# Patient Record
Sex: Female | Born: 1980 | Race: Black or African American | Hispanic: No | Marital: Married | Smoking: Never smoker
Health system: Southern US, Community
[De-identification: ages and names within clinical notes are randomized; demographics above are authoritative.]

## PROBLEM LIST (undated history)

## (undated) DIAGNOSIS — O24419 Gestational diabetes mellitus in pregnancy, unspecified control: Secondary | ICD-10-CM

## (undated) DIAGNOSIS — K589 Irritable bowel syndrome without diarrhea: Secondary | ICD-10-CM

## (undated) DIAGNOSIS — O09219 Supervision of pregnancy with history of pre-term labor, unspecified trimester: Secondary | ICD-10-CM

## (undated) DIAGNOSIS — I471 Supraventricular tachycardia: Secondary | ICD-10-CM

## (undated) DIAGNOSIS — O24414 Gestational diabetes mellitus in pregnancy, insulin controlled: Secondary | ICD-10-CM

## (undated) DIAGNOSIS — I499 Cardiac arrhythmia, unspecified: Secondary | ICD-10-CM

## (undated) DIAGNOSIS — E119 Type 2 diabetes mellitus without complications: Secondary | ICD-10-CM

## (undated) DIAGNOSIS — D649 Anemia, unspecified: Secondary | ICD-10-CM

## (undated) HISTORY — PX: FOOT SURGERY: SHX648

## (undated) HISTORY — DX: Type 2 diabetes mellitus without complications: E11.9

---

## 2002-07-29 HISTORY — PX: MOUTH SURGERY: SHX715

## 2010-04-12 ENCOUNTER — Emergency Department (HOSPITAL_COMMUNITY): Admission: EM | Admit: 2010-04-12 | Discharge: 2010-04-12 | Payer: Self-pay | Admitting: Emergency Medicine

## 2010-10-11 LAB — GC/CHLAMYDIA PROBE AMP, GENITAL: Chlamydia, DNA Probe: NEGATIVE

## 2010-10-11 LAB — WET PREP, GENITAL: Clue Cells Wet Prep HPF POC: NONE SEEN

## 2010-10-11 LAB — URINE MICROSCOPIC-ADD ON

## 2010-10-11 LAB — URINALYSIS, ROUTINE W REFLEX MICROSCOPIC
Bilirubin Urine: NEGATIVE
Glucose, UA: NEGATIVE mg/dL
Specific Gravity, Urine: 1.022 (ref 1.005–1.030)
Urobilinogen, UA: 0.2 mg/dL (ref 0.0–1.0)
pH: 6 (ref 5.0–8.0)

## 2012-07-29 NOTE — L&D Delivery Note (Signed)
Delivery Note At 7:27 AM a viable female, "[place]", was delivered via Vaginal, Spontaneous Delivery (Presentation: ; Occiput Anterior).  APGAR: 9, 9; weight .   Placenta status: Intact, Spontaneous Pathology.  Cord: 3 vessels with the following complications: None.  Cord pH: NA NICU team present for delivery due to prematurity.  Patient's labor had progressed rapidly after transfer to [facility] received her 2nd dose of Betamethasone at 6:13am and a 4 gm bolus of magnesium beginning at 6:07am for the neuroprophylaxis process.  She had a single gush of moderate bleeding during the immediate postdelivery phase--C[employer] 800 mcg placed per rectum for bleeding prophylaxis.  Anesthesia: None  Episiotomy: None Lacerations: None Suture Repair: NA Est. Blood Loss (mL): 400  Mom to postpartum.  Baby to NICU.  FOB to NICU with baby. Placenta to pathology.  Philomene Haff 01/29/2013, 8:02 AM

## 2012-09-15 LAB — OB RESULTS CONSOLE HEPATITIS B SURFACE ANTIGEN: Hepatitis B Surface Ag: NEGATIVE

## 2012-09-15 LAB — OB RESULTS CONSOLE RPR: RPR: NONREACTIVE

## 2012-09-15 LAB — OB RESULTS CONSOLE HIV ANTIBODY (ROUTINE TESTING)
HIV: NONREACTIVE
HIV: NONREACTIVE

## 2012-09-15 LAB — OB RESULTS CONSOLE ABO/RH: RH Type: POSITIVE

## 2013-01-13 ENCOUNTER — Encounter: Payer: Managed Care, Other (non HMO) | Attending: Obstetrics and Gynecology | Admitting: *Deleted

## 2013-01-13 VITALS — Ht 64.5 in | Wt 223.4 lb

## 2013-01-13 DIAGNOSIS — Z713 Dietary counseling and surveillance: Secondary | ICD-10-CM | POA: Insufficient documentation

## 2013-01-13 DIAGNOSIS — O99810 Abnormal glucose complicating pregnancy: Secondary | ICD-10-CM | POA: Insufficient documentation

## 2013-01-14 ENCOUNTER — Encounter: Payer: Self-pay | Admitting: *Deleted

## 2013-01-14 NOTE — Progress Notes (Signed)
  Patient was seen on 01/13/13 for Gestational Diabetes self-management class at [facility]. The following learning objectives were met by the patient during this course:   States the definition of Gestational Diabetes  States why dietary management is important in controlling blood glucose  Describes the effects each nutrient has on blood glucose levels  Demonstrates ability to create a balanced meal plan  Demonstrates carbohydrate counting   States when to check blood glucose levels  Demonstrates proper blood glucose monitoring techniques  States the effect of stress and exercise on blood glucose levels  States the importance of limiting caffeine and abstaining from alcohol and smoking  Blood glucose monitor given: [employer] Next EZ Lot # [id number] Exp: 10/2013 Blood glucose reading: 107 md/dl  Patient instructed to monitor glucose levels: FBS: 60 - <90 2 hour: <120  *Patient received handouts:  Nutrition Diabetes and Pregnancy  Carbohydrate Counting List  Patient will be seen for follow-up as needed.

## 2013-01-14 NOTE — Patient Instructions (Signed)
Goals:  Check glucose levels per MD as instructed  Follow Gestational Diabetes Diet as instructed  Call for follow-up as needed    

## 2013-01-24 ENCOUNTER — Telehealth: Payer: Self-pay

## 2013-01-24 NOTE — Telephone Encounter (Signed)
Pt called to report 29 weeks and had green, mucousy d/c w/ slight odor this afternoon.  Has been at church.  Denies VB today, LOF.  No abdominal pain, cramping, or tightening.  Normal FM.  At office 2d ago and reports blood in urine.  Cx not checked Friday, but had been checked 2 weeks prior, and long/closed.  No vaginal irritation.  Reports post-coital bleeding last week, but none since.  Rh positive.  Offered MAU eval and declined for now to CTO d/c at present, and would like appointment at office tomorrow.  Rev'd PTL s/s and pt to f/u prn overnight if concerns, worsening d/c, or ctxs.  Rec'd pelvic and overall rest this evening, & adequate hydration.

## 2013-01-28 ENCOUNTER — Encounter (HOSPITAL_COMMUNITY): Payer: Self-pay | Admitting: *Deleted

## 2013-01-28 ENCOUNTER — Inpatient Hospital Stay (HOSPITAL_COMMUNITY)
Admission: AD | Admit: 2013-01-28 | Discharge: 2013-01-31 | DRG: 775 | Disposition: A | Discharge status: Home / Self Care | Payer: 59 | Source: Ambulatory Visit | Attending: Obstetrics and Gynecology | Admitting: Obstetrics and Gynecology

## 2013-01-28 ENCOUNTER — Ambulatory Visit (HOSPITAL_COMMUNITY): Payer: 59

## 2013-01-28 ENCOUNTER — Inpatient Hospital Stay (HOSPITAL_COMMUNITY): Payer: 59

## 2013-01-28 ENCOUNTER — Other Ambulatory Visit: Payer: Self-pay | Admitting: Obstetrics and Gynecology

## 2013-01-28 DIAGNOSIS — O4290 Premature rupture of membranes, unspecified as to length of time between rupture and onset of labor, unspecified weeks of gestation: Principal | ICD-10-CM | POA: Diagnosis present

## 2013-01-28 DIAGNOSIS — O99814 Abnormal glucose complicating childbirth: Secondary | ICD-10-CM | POA: Diagnosis present

## 2013-01-28 HISTORY — DX: Anemia, unspecified: D64.9

## 2013-01-28 HISTORY — DX: Gestational diabetes mellitus in pregnancy, unspecified control: O24.419

## 2013-01-28 LAB — ABO/RH: ABO/RH(D): O POS

## 2013-01-28 LAB — TYPE AND SCREEN: Antibody Screen: NEGATIVE

## 2013-01-28 MED ORDER — AZITHROMYCIN 1 G PO PACK
1.0000 g | PACK | Freq: Once | ORAL | Status: AC
  Administered 2013-01-28: 1 g via ORAL
  Filled 2013-01-28: qty 1

## 2013-01-28 MED ORDER — DOCUSATE SODIUM 100 MG PO CAPS
100.0000 mg | ORAL_CAPSULE | Freq: Every day | ORAL | Status: DC
  Administered 2013-01-28: 100 mg via ORAL
  Filled 2013-01-28: qty 1

## 2013-01-28 MED ORDER — LACTATED RINGERS IV SOLN
INTRAVENOUS | Status: DC
  Administered 2013-01-28 – 2013-01-29 (×2): via INTRAVENOUS

## 2013-01-28 MED ORDER — MAGNESIUM SULFATE 40 G IN LACTATED RINGERS - SIMPLE
2.0000 g/h | INTRAVENOUS | Status: DC
  Administered 2013-01-28: 2 g/h via INTRAVENOUS
  Filled 2013-01-28: qty 500

## 2013-01-28 MED ORDER — BETAMETHASONE SOD PHOS & ACET 6 (3-3) MG/ML IJ SUSP
12.0000 mg | INTRAMUSCULAR | Status: DC
  Administered 2013-01-28: 12 mg via INTRAMUSCULAR
  Filled 2013-01-28 (×2): qty 2

## 2013-01-28 MED ORDER — CALCIUM CARBONATE ANTACID 500 MG PO CHEW: 2.0000 | CHEWABLE_TABLET | ORAL | Status: DC | PRN

## 2013-01-28 MED ORDER — ZOLPIDEM TARTRATE 5 MG PO TABS: 5.0000 mg | ORAL_TABLET | Freq: Every evening | ORAL | Status: DC | PRN

## 2013-01-28 MED ORDER — PRENATAL MULTIVITAMIN CH: 1.0000 | ORAL_TABLET | Freq: Every day | ORAL | Status: DC

## 2013-01-28 MED ORDER — MAGNESIUM SULFATE BOLUS VIA INFUSION
4.0000 g | Freq: Once | INTRAVENOUS | Status: AC
  Administered 2013-01-28: 4 g via INTRAVENOUS
  Filled 2013-01-28: qty 500

## 2013-01-28 MED ORDER — LACTATED RINGERS IV BOLUS (SEPSIS)
1000.0000 mL | Freq: Once | INTRAVENOUS | Status: AC
  Administered 2013-01-28: 1000 mL via INTRAVENOUS

## 2013-01-28 MED ORDER — ACETAMINOPHEN 325 MG PO TABS
650.0000 mg | ORAL_TABLET | ORAL | Status: DC | PRN
  Filled 2013-01-28: qty 2

## 2013-01-28 MED ORDER — SODIUM CHLORIDE 0.9 % IV SOLN
3.0000 g | Freq: Four times a day (QID) | INTRAVENOUS | Status: DC
  Administered 2013-01-28 – 2013-01-29 (×3): 3 g via INTRAVENOUS
  Filled 2013-01-28 (×5): qty 3

## 2013-01-28 MED ORDER — INSULIN ASPART 100 UNIT/ML ~~LOC~~ SOLN: 0.0000 [IU] | Freq: Three times a day (TID) | SUBCUTANEOUS | Status: DC

## 2013-01-28 NOTE — H&P (Signed)
Amber Davenport is a 32 y.o. female  pateint of Dr. [name]  ( G2 P0010 )at  30 wks and 3 days based on 8 wk ultrasound with EDD 04/05/2013. Pt presented to the office today complaining of lof this AM . Gross pooling on exam.  She denies abdominal tenderness. +FM no contractions.  Her pregnancy has been complicated by A1 DM . She reports that her bs have been well controlled.   She started her pregnancy at [facility] in [place] with Dr. Arlyce Harman. She tranferred to Dr. [name] at 27 wks for [facility].    Maternal Medical History:  Reason for admission: Rupture of membranes.   Contractions: Frequency: rare.    Fetal activity: Perceived fetal activity is normal.   Last perceived fetal movement was within the past hour.      OB History   Grav Para Term Preterm Abortions TAB SAB Ect Mult Living   1              G2 P0010    Past Medical History  Diagnosis Date  . Diabetes mellitus without complication   . Anemia   . Gestational diabetes    Past Surgical History  Procedure Laterality Date  . Mouth surgery  2004   Family History: family history includes Asthma in her brother; Drug abuse in her maternal grandfather; and Hypertension in her maternal grandmother. Social History:  reports that she has never smoked. She does not have any smokeless tobacco history on file. She reports that she does not drink alcohol or use illicit drugs.   Prenatal Transfer Tool  Maternal Diabetes: Yes:  Diabetes Type:  Diet controlled Genetic Screening: Normal Maternal Ultrasounds/Referrals: Normal Fetal Ultrasounds or other Referrals:  None Maternal Substance Abuse:  No Significant Maternal Medications:  None Significant Maternal Lab Results:  Lab values include: Other:  Other Comments:  PPROM at 30 wks and 3 days...   Review of Systems  Constitutional: Negative.   HENT: Negative.   Eyes: Negative.   Cardiovascular: Negative.   Gastrointestinal: Negative.    Genitourinary: Negative.   Musculoskeletal: Negative.   Neurological: Negative.   Psychiatric/Behavioral: Negative.       Blood pressure 132/74, pulse 97, temperature 98.3 F (36.8 C), temperature source Oral, resp. rate 20, height 5' 4.5" (1.638 m), weight 102.513 kg (226 lb), last menstrual period 06/29/2012, SpO2 96.00%. Maternal Exam:  Abdomen: Patient reports no abdominal tenderness. Fetal presentation: vertex  Introitus: Normal vulva. Normal vagina.    Fetal Exam Fetal Monitor Review: Mode: fetoscope.   Baseline rate: 130.  Variability: moderate (6-25 bpm).    Fetal State Assessment: Category I - tracings are normal.     Physical Exam  Constitutional: She is oriented to person, place, and time.  Cardiovascular: Normal rate and regular rhythm.   Respiratory: Effort normal.  Musculoskeletal: Normal range of motion.  Neurological: She is alert and oriented to person, place, and time.  Skin: Skin is warm and dry.  Sterile exam gross pooling.  cx1.5 /50 / -2 cephalic   Prenatal labs: ABO, Rh: --/--/O POS, O POS (07/03 1440) Antibody: NEG (07/03 1440) Rubella: Immune (02/18 0000) RPR: Nonreactive, Nonreactive, Nonreactive (02/18 0000)  HBsAg: Negative (02/18 0000)  HIV: Non-reactive, Non-reactive, Non-reactive (02/18 0000)  GBS:     Wet mount in office few clue cells other wise normal.... GBS in office pending. GC/ chl in office pending.  Assessment/Plan: 30 wks and 3 days with PPROM / Unasyn/ azithomycin  to prolong latency/ Bethamethasone for fetal lung maturity first dose given at 1411.  Ultrasound today BPP 4 out of 10... nst reactive./// no breathing or movement and low afi on ultrasound.  MFM consult was obtained.. Dr. Sherrie George suggested discontinuing the magnesium sulfate and  Repeating BPP in 12 hours. fiven the reassuring NST And maternal reports of fetal movement.   A1DM- CBG fasting and 2 hour postprandial ... Diabetic diet Appreciate NICU and MFM consult.       Waymon Laser J. 01/28/2013, 6:22 PM

## 2013-01-28 NOTE — Consult Note (Signed)
MFM Note  Amber Davenport is a 32 year old G2P0A1 at 30+3 weeks who was admitted today for spontaneous rupture of membranes. She denies any prenatal complications except for diet controlled gestational diabetes. This morning her membranes ruptured while at work. She reports good fetal movement and denies any uterine activity.  OB history: SAB at 6 weeks; no D&C  She has no other medical or surgical history except for occasional IBS. No drug allergies.  Since admission she has received BMZ, antibiotics and magnesium sulfate.  Ultrasound revealed a well grown fetus with EFW at the 38th %tile in a cephalic presentation. There was severe oligohydramnios and a BPP was 2/8 (-2 for no fluid from SROM, -2 for no BM and -2 for tone). The fetal heart tracing, however, was very reactive and good variability. It was decided to repeat the BPP in ~ 6 hours. Before the second BPP was preformed, however, magnesium sulfate was started for CP prophylaxis. The second BPP was 2/10 with 2 points for a reactive tracing. It was thought that the fetus was depressed due to the magnesium sulfate.  There were no signs or symptoms of infection. Her uterus was completely non tender.   Assessment: 1) IUP at 30 weeks; cephalic presentation 2) PPROM 3) Fetal status reassuring due to reactive tracing and patient's report of fetal movement 4) Diet controlled gestational diabetes 5) S/P NICU consultation 6) IBS  Plan:  1) Discontinue the magnesium sulfate 2) Repeat BPP in 12 hours; consider deliver if no improvement in BPP 3) Continuous monitoring 4) Complete course of BMZ 5) Deliver with s/s in infection; restart magnesium sulfate for CP px  Please call us with any questions or concerns.  (Face-to-face consultation with patient: 30 min)

## 2013-01-28 NOTE — Consult Note (Signed)
Neonatology Consult to Antenatal Patient:  I was asked by Dr. Richardson Dopp to see this patient in order to provide antenatal counseling due to premature ROM at [redacted] weeks GA.  Amber Davenport is admitted today at [redacted] weeks GA. Her pregnancy has been complicated by GDM. She had SROM today and is admitted for expectant management. She is currently not having active labor. She is getting BMZ, Magnesium Sulfate, and IV Ampicillin.  I spoke with the patient, her husband, and several family members. We discussed the worst case of delivery in the next 1-2 days, including usual DR management, possible respiratory complications and need for support, IV access, feedings (mother desires breast feeding, which was encourage), LOS, Mortality and Morbidity, and long term outcomes. They had several questions, which I answered. I offered a NICU tour to any interested family members and would be glad to come back if this couple or their family has more questions later.  Thank you for asking me to see this delightful patient.  Doretha Sou, MD Neonatologist  The total length of face-to-face or floor/unit time for this encounter was 30 minutes. Counseling and/or coordination of care was 20 minutes of the above.

## 2013-01-28 NOTE — Progress Notes (Signed)
Antenatal Nutrition Assessment:  Currently  30 3/[redacted] weeks gestation, with PROM, GDM diet controlled. Height  64.5 in Weight 226 Lbs pre-pregnancy weight 186 Lbs.Pre-pregnancy  BMI 31.5  IBW 123 Lbs  Total weight gain 40 Lbs. Weight gain goals 11-20 Lbs.   Estimated needs: 19-2100 kcal/day, 70-80 grams protein/day, 2.2 liters fluid/day  Gestational carbohydrate modified diet Current diet prescription will provide for increased needs of pregnancy  No abnormal nutrition related labs CBG (last 3)  No results found for this basename: GLUCAP,  in the last 72 hours   Nutrition Dx: Increased nutrient needs r/t pregnancy and fetal growth requirements aeb [redacted] weeks gestation.  No educational needs assessed at this time. Pt received GDM education at [facility] on 01/13/13  Elisabeth Cara M.Odis Luster LDN Neonatal Nutrition Support Specialist Pager [phone]

## 2013-01-29 ENCOUNTER — Other Ambulatory Visit (HOSPITAL_COMMUNITY): Payer: Managed Care, Other (non HMO)

## 2013-01-29 ENCOUNTER — Encounter (HOSPITAL_COMMUNITY): Payer: Self-pay | Admitting: *Deleted

## 2013-01-29 LAB — GLUCOSE, CAPILLARY: Glucose-Capillary: 103 mg/dL — ABNORMAL HIGH (ref 70–99)

## 2013-01-29 LAB — CBC
Hemoglobin: 11 g/dL — ABNORMAL LOW (ref 12.0–15.0)
Platelets: 164 10*3/uL (ref 150–400)
RBC: 3.98 MIL/uL (ref 3.87–5.11)
WBC: 14.2 10*3/uL — ABNORMAL HIGH (ref 4.0–10.5)

## 2013-01-29 MED ORDER — OXYCODONE-ACETAMINOPHEN 5-325 MG PO TABS: 1.0000 | ORAL_TABLET | ORAL | Status: DC | PRN

## 2013-01-29 MED ORDER — METHYLERGONOVINE MALEATE 0.2 MG/ML IJ SOLN: 0.2000 mg | INTRAMUSCULAR | Status: DC | PRN

## 2013-01-29 MED ORDER — LANOLIN HYDROUS EX OINT: TOPICAL_OINTMENT | CUTANEOUS | Status: DC | PRN

## 2013-01-29 MED ORDER — LIDOCAINE HCL (PF) 1 % IJ SOLN
INTRAMUSCULAR | Status: AC
  Filled 2013-01-29: qty 30

## 2013-01-29 MED ORDER — SENNOSIDES-DOCUSATE SODIUM 8.6-50 MG PO TABS
2.0000 | ORAL_TABLET | Freq: Every day | ORAL | Status: DC
  Administered 2013-01-29: 2 via ORAL

## 2013-01-29 MED ORDER — MAGNESIUM SULFATE BOLUS VIA INFUSION
4.0000 g | Freq: Once | INTRAVENOUS | Status: AC
  Administered 2013-01-29: 4 g via INTRAVENOUS
  Filled 2013-01-29: qty 500

## 2013-01-29 MED ORDER — DIBUCAINE 1 % RE OINT: 1.0000 "application " | TOPICAL_OINTMENT | RECTAL | Status: DC | PRN

## 2013-01-29 MED ORDER — MAGNESIUM SULFATE 40 G IN LACTATED RINGERS - SIMPLE
1.0000 g/h | INTRAVENOUS | Status: DC
  Filled 2013-01-29: qty 500

## 2013-01-29 MED ORDER — PRENATAL MULTIVITAMIN CH: 1.0000 | ORAL_TABLET | Freq: Every day | ORAL | Status: DC

## 2013-01-29 MED ORDER — FERROUS SULFATE 325 (65 FE) MG PO TABS
325.0000 mg | ORAL_TABLET | Freq: Two times a day (BID) | ORAL | Status: DC
  Administered 2013-01-29 – 2013-01-31 (×4): 325 mg via ORAL
  Filled 2013-01-29 (×4): qty 1

## 2013-01-29 MED ORDER — PRENATAL MULTIVITAMIN CH
1.0000 | ORAL_TABLET | Freq: Every day | ORAL | Status: DC
  Administered 2013-01-29 – 2013-01-30 (×2): 1 via ORAL
  Filled 2013-01-29 (×2): qty 1

## 2013-01-29 MED ORDER — ONDANSETRON HCL 4 MG/2ML IJ SOLN: 4.0000 mg | INTRAMUSCULAR | Status: DC | PRN

## 2013-01-29 MED ORDER — MAGNESIUM SULFATE 40 G IN LACTATED RINGERS - SIMPLE: 2.0000 g/h | INTRAVENOUS | Status: DC

## 2013-01-29 MED ORDER — BETAMETHASONE SOD PHOS & ACET 6 (3-3) MG/ML IJ SUSP
12.0000 mg | Freq: Once | INTRAMUSCULAR | Status: AC
  Administered 2013-01-29: 12 mg via INTRAMUSCULAR
  Filled 2013-01-29: qty 2

## 2013-01-29 MED ORDER — ZOLPIDEM TARTRATE 5 MG PO TABS: 5.0000 mg | ORAL_TABLET | Freq: Every evening | ORAL | Status: DC | PRN

## 2013-01-29 MED ORDER — DIPHENHYDRAMINE HCL 25 MG PO CAPS: 25.0000 mg | ORAL_CAPSULE | Freq: Four times a day (QID) | ORAL | Status: DC | PRN

## 2013-01-29 MED ORDER — OXYTOCIN 40 UNITS IN LACTATED RINGERS INFUSION - SIMPLE MED
INTRAVENOUS | Status: AC
  Administered 2013-01-29: 40 [IU]
  Filled 2013-01-29: qty 1000

## 2013-01-29 MED ORDER — IBUPROFEN 600 MG PO TABS
600.0000 mg | ORAL_TABLET | Freq: Four times a day (QID) | ORAL | Status: DC
  Administered 2013-01-29 – 2013-01-31 (×6): 600 mg via ORAL
  Filled 2013-01-29 (×8): qty 1

## 2013-01-29 MED ORDER — SIMETHICONE 80 MG PO CHEW: 80.0000 mg | CHEWABLE_TABLET | ORAL | Status: DC | PRN

## 2013-01-29 MED ORDER — TETANUS-DIPHTH-ACELL PERTUSSIS 5-2.5-18.5 LF-MCG/0.5 IM SUSP: 0.5000 mL | Freq: Once | INTRAMUSCULAR | Status: DC

## 2013-01-29 MED ORDER — METHYLERGONOVINE MALEATE 0.2 MG PO TABS: 0.2000 mg | ORAL_TABLET | ORAL | Status: DC | PRN

## 2013-01-29 MED ORDER — BENZOCAINE-MENTHOL 20-0.5 % EX AERO: 1.0000 "application " | INHALATION_SPRAY | CUTANEOUS | Status: DC | PRN

## 2013-01-29 MED ORDER — MISOPROSTOL 200 MCG PO TABS
ORAL_TABLET | ORAL | Status: AC
  Administered 2013-01-29: 800 ug
  Filled 2013-01-29: qty 4

## 2013-01-29 MED ORDER — WITCH HAZEL-GLYCERIN EX PADS: 1.0000 "application " | MEDICATED_PAD | CUTANEOUS | Status: DC | PRN

## 2013-01-29 MED ORDER — MAGNESIUM SULFATE 40 G IN LACTATED RINGERS - SIMPLE: 4.0000 g/h | Freq: Once | INTRAVENOUS | Status: DC

## 2013-01-29 MED ORDER — ONDANSETRON HCL 4 MG PO TABS: 4.0000 mg | ORAL_TABLET | ORAL | Status: DC | PRN

## 2013-01-29 NOTE — Consult Note (Signed)
Neonatology Note:  Attendance at Delivery:  I was asked by Dr. Cole to attend this NSVD at 30 4/[redacted] weeks GA following SROM yesterday and progression of labor. The mother is a G2P0A1 O pos, GBS not done yet with SROM about 24 hours prior to delivery, fluid clear. She is a diet-controlled GDM. She received antibiotics, magnesium sulfate, and Betamethasone times 1 dose (about 18 hours prior to delivery). Labor progressed. Mother remained afebrile during labor. Infant vigorous with good spontaneous cry and tone at birth. Needed bulb suctioning, then BBO2. O2 saturations were noted to be adequate only in BBO2, so we placed her on the [place] at 4 cm of pressure and 50% FIO2 for transport to the NICU. Her parents were able tohold her briefly in the DR with BBO2 on her. Ap 9/9. Her father accompanied her to The NICU.  Sherril Shipman C. Eren Puebla, MD  

## 2013-01-29 NOTE — Clinical Social Work Maternal (Signed)
Clinical Social Work Department PSYCHOSOCIAL ASSESSMENT - MATERNAL/CHILD 01/29/2013  Patient:  Amber Davenport,Amber Davenport  Account Number:  [id number]  Admit Date:  01/28/2013  Childs Name:   Amber Davenport    Clinical Social Worker:  Colbie Sliker, LCSW   Date/Time:  01/29/2013 01:55 PM  Date Referred:  01/29/2013   Referral source  NICU     Referred reason  NICU   Other referral source:    I:  FAMILY / HOME ENVIRONMENT Child's legal guardian:  PARENT  Guardian - Name Guardian - Age Guardian - Address  Amber Davenport [address] [place], [place] [address]  Amber Mcgough Jr.  same   Other household support members/support persons Other support:   MOB states she has a great support system.  There were numerous family members and friends with her today.    II  PSYCHOSOCIAL DATA Information Source:  Patient Interview  Financial and Community Resources Employment:   CSW did not discuss employment at this time.   Financial resources:  Private Insurance If Medicaid - County:    School / Grade:   Maternity Care Coordinator / Child Services Coordination / Early Interventions:  Cultural issues impacting care:   None identified    III  STRENGTHS Strengths  Adequate Resources  Compliance with medical plan  Home prepared for Child (including basic supplies)  Other - See comment  Supportive family/friends  Understanding of illness   Strength comment:  Pediatric follow up will be with Dr. Thomas at [facility].   IV  RISK FACTORS AND CURRENT PROBLEMS Current Problem:  None     V  SOCIAL WORK ASSESSMENT  CSW met with MOB in her third floor room/319 to introduce myself and complete assessment due to NICU admission.  MOB was very upbeat and pleasant, but just delivered this morning and had numerous visitors with her, so CSW offered to come back another if that would be better for her.  She told CSW that we could talk now.  She states she  and baby are doing great at this time.  She states she had no indication that baby would be born prematurely until yesterday when her water broke at work.  She appears to be coping very well with the situation at this point and reports a good understanding of baby's medical condition.  CSW discussed, in general terms, what baby will need to accomplish prior to being ready for discharge.  MOB states she hopes baby will not have to be here until her due date, which was 04/05/13.  CSW explained that although she may not need 10 weeks in NICU, CSW encouraged family to keep due date in mind so that they aren't continuously let down if baby is not ready to go home when the hope for.  CSW explained that there may be ups and downs throughout her hospitalization and advised to try to take things one day at a time instead of having expectations.  They seemed understanding and appreciative of this way of looking at the situation.  MOB reports having almost everything they need for baby and the ability to get the rest.  CSW recommended that they put some money aside for the car seat to see how big baby is closer to d/c and that bedside RN can help them determine the right seat for baby at that time.  CSW explained the ability for them to call a family conference at any time by contacting CSW and CSW told them not to   be alarmed if we contact them for a meeting.  CSW explained ongoing support services offered by NICU CSW and gave contact information.  CSW has no social concerns at this time.   VI SOCIAL WORK PLAN Social Work Plan  Psychosocial Support/Ongoing Assessment of Needs   Type of pt/family education:   What to expect from a NICU hospitalization (in general terms)  Ongoing support services offered by NICU CSW   If child protective services report - county:   If child protective services report - date:   Information/referral to community resources comment:   No referral needs identified at this time.   Other  social work plan:    

## 2013-01-29 NOTE — Progress Notes (Signed)
Patient ID: Amber Davenport, female   DOB: 10-19-1980, 32 y.o.   MRN: [id number] Amber Davenport is a 32 y.o. G1P0 at [redacted]w[redacted]d by LMP admitted for PROM.  Pt began having contractions this am  Subjective: Only c/o cramping        Objective: BP 134/70  Pulse 90  Temp(Src) 98.2 F (36.8 C) (Oral)  Resp 20  Ht 5' 4.5" (1.638 m)  Wt 226 lb (102.513 kg)  BMI 38.21 kg/m2  SpO2 96%  LMP 06/29/2012 I/O last 3 completed shifts: In: 1424.2 [P.O.:5; I.V.:1319.2; IV Piggyback:100] Out: 1250 [Urine:1250]    FHT:  FHR: 140s bpm, variability: moderate,  accelerations:  Present,  decelerations:  Present variables UC:   Not tracing but palpable SVE:   Dilation: 4 Effacement (%): 80 Station: 0;+1 Exam by:: Arvin Collard, RN  Labs: Lab Results  Component Value Date   WBC 14.2* 01/29/2013   HGB 11.0* 01/29/2013   HCT 33.2* 01/29/2013   MCV 83.4 01/29/2013   PLT 164 01/29/2013    Assessment  PPROM at 30 wks S/p single betamethasone dose Nonreassuring BPP  Preterm labor Fetal Wellbeing:  Category I  Plan Repeat betamethasone now Neuroprophylaxis dose of magnesium sulfate Move to L&D for probable delivery  Renato Spellman P 01/29/2013, 5:54 AM

## 2013-01-30 LAB — CBC
HCT: 30.7 % — ABNORMAL LOW (ref 36.0–46.0)
RDW: 14 % (ref 11.5–15.5)
WBC: 17.9 10*3/uL — ABNORMAL HIGH (ref 4.0–10.5)

## 2013-01-30 NOTE — Progress Notes (Signed)
Post Partum Day 1 s/p vaginal delivery at 30 wks and 4 days  Subjective: no complaints, up ad lib, voiding and tolerating PO  Objective: Blood pressure 140/79, pulse 76, temperature 97.6 F (36.4 C), temperature source Oral, resp. rate 17, height 5' 4.5" (1.638 m), weight 102.513 kg (226 lb), last menstrual period 06/29/2012, SpO2 100.00%, unknown if currently breastfeeding.  Physical Exam:  General: alert and cooperative Lochia: appropriate Uterine Fundus: firm Incision: NA DVT Evaluation: No evidence of DVT seen on physical exam.   Recent Labs  01/29/13 0520 01/30/13 0525  HGB 11.0* 10.2*  HCT 33.2* 30.7*    Assessment/Plan: Plan for discharge tomorrow, Breastfeeding and Lactation consult   LOS: 2 days   Amber Davenport J. 01/30/2013, 10:20 AM

## 2013-01-31 MED ORDER — IBUPROFEN 600 MG PO TABS: 600.0000 mg | ORAL_TABLET | Freq: Four times a day (QID) | ORAL | Status: DC | PRN

## 2013-01-31 NOTE — Discharge Summary (Signed)
Obstetric Discharge Summary Reason for Admission: rupture of membranes Prenatal Procedures: ultrasound Intrapartum Procedures: spontaneous vaginal delivery Postpartum Procedures: none Complications-Operative and Postpartum: none Hemoglobin  Date Value Range Status  01/30/2013 10.2* 12.0 - 15.0 g/dL Final     HCT  Date Value Range Status  01/30/2013 30.7* 36.0 - 46.0 % Final    Physical Exam:  General: alert and cooperative Lochia: appropriate Uterine Fundus: firm Incision: NA DVT Evaluation: No evidence of DVT seen on physical exam.  Discharge Diagnoses: Premature labor, PROM xpreterm delivery  hours and preterm delivery   Discharge Information: Date: 01/31/2013 Activity: pelvic rest Diet: routine Medications: PNV, Ibuprofen and Iron Condition: stable Instructions: refer to practice specific booklet Discharge to: home Follow-up Information   Follow up with [name], MD. Schedule an appointment as soon as possible for a visit in 6 weeks. (postpartum visit )    Contact information:   [address], [address] [place] [address] [phone]       Newborn Data: Live born female  Birth Weight:  APGAR: 9, 9  Home with mother.  Amber Roulston J. 01/31/2013, 8:28 AM

## 2013-01-31 NOTE — Progress Notes (Signed)
Discharge instructions provided to patient at bedside.  Medications, activity, follow up appointments, when to call the doctor and community resources discussed.  No questions at this time.  Patient left unit in stable condition with all personal belongings.  Osvaldo Angst, RN-----------

## 2013-02-01 ENCOUNTER — Encounter (HOSPITAL_COMMUNITY): Payer: Self-pay | Admitting: *Deleted

## 2013-02-01 NOTE — Progress Notes (Signed)
Ur chart review completed.  

## 2013-02-17 ENCOUNTER — Ambulatory Visit: Payer: Self-pay

## 2013-02-17 NOTE — Lactation Note (Signed)
This note was copied from the chart of Girl Bekah Igoe. Lactation Consultation Note  Patient Name: Girl Enijah Furr [id number] Date: 02/17/2013     Maternal Data    Feeding Feeding Type: Breast Milk Length of feed: 30 min  LATCH Score/Interventions                      Lactation Tools Discussed/Used     Consult Status    Follow up consult with this mom of a now 33 2/7 week corrected gestation baby, weighing 3-12.9. Mom nuzzled with baby yesterday, and was disappointed that the baby was sleepy at the breast. Mom told me she was not going to try until the baby was older. I told her it was normal and ok that the baby did not suckle, and that she need not stop nuzzling with ng feeds. I explained that is is still breast feeding for her baby, just to be skin to skin, smell and taste mom;s milk, for now. Mom seemed a bit relieved, but not quite convinced. i will follow this family in the NICU      Alfred Levins 02/17/2013, 6:17 PM

## 2013-03-17 ENCOUNTER — Other Ambulatory Visit: Payer: Self-pay | Admitting: Obstetrics and Gynecology

## 2013-03-17 ENCOUNTER — Other Ambulatory Visit (HOSPITAL_COMMUNITY)
Admission: RE | Admit: 2013-03-17 | Discharge: 2013-03-17 | Disposition: A | Discharge status: Home / Self Care | Payer: 59 | Source: Ambulatory Visit | Attending: Obstetrics and Gynecology | Admitting: Obstetrics and Gynecology

## 2013-03-17 DIAGNOSIS — Z01419 Encounter for gynecological examination (general) (routine) without abnormal findings: Secondary | ICD-10-CM | POA: Insufficient documentation

## 2013-03-17 DIAGNOSIS — Z1151 Encounter for screening for human papillomavirus (HPV): Secondary | ICD-10-CM | POA: Insufficient documentation

## 2013-07-04 ENCOUNTER — Emergency Department (HOSPITAL_COMMUNITY)
Admission: EM | Admit: 2013-07-04 | Discharge: 2013-07-04 | Disposition: A | Discharge status: Home / Self Care | Payer: 59 | Attending: Emergency Medicine | Admitting: Emergency Medicine

## 2013-07-04 ENCOUNTER — Encounter (HOSPITAL_COMMUNITY): Payer: Self-pay | Admitting: Emergency Medicine

## 2013-07-04 DIAGNOSIS — Z862 Personal history of diseases of the blood and blood-forming organs and certain disorders involving the immune mechanism: Secondary | ICD-10-CM | POA: Insufficient documentation

## 2013-07-04 DIAGNOSIS — Z3202 Encounter for pregnancy test, result negative: Secondary | ICD-10-CM | POA: Insufficient documentation

## 2013-07-04 DIAGNOSIS — N390 Urinary tract infection, site not specified: Secondary | ICD-10-CM | POA: Insufficient documentation

## 2013-07-04 DIAGNOSIS — E119 Type 2 diabetes mellitus without complications: Secondary | ICD-10-CM | POA: Insufficient documentation

## 2013-07-04 DIAGNOSIS — Z91040 Latex allergy status: Secondary | ICD-10-CM | POA: Insufficient documentation

## 2013-07-04 LAB — URINALYSIS, ROUTINE W REFLEX MICROSCOPIC
Glucose, UA: NEGATIVE mg/dL
Nitrite: NEGATIVE
Specific Gravity, Urine: 1.034 — ABNORMAL HIGH (ref 1.005–1.030)
pH: 5.5 (ref 5.0–8.0)

## 2013-07-04 LAB — URINE MICROSCOPIC-ADD ON

## 2013-07-04 LAB — PREGNANCY, URINE: Preg Test, Ur: NEGATIVE

## 2013-07-04 MED ORDER — CEPHALEXIN 500 MG PO CAPS: 500.0000 mg | ORAL_CAPSULE | Freq: Two times a day (BID) | ORAL | Status: DC

## 2013-07-04 NOTE — ED Provider Notes (Signed)
CSN: [id number]     Arrival date & time 07/04/13  1430 History   First MD Initiated Contact with Patient 07/04/13 1600     Chief Complaint  Patient presents with  . Urinary Tract Infection   (Consider location/radiation/quality/duration/timing/severity/associated sxs/prior Treatment) HPI Comments: Patient is a 32 year old female with no significant past medical history who presents today for dysuria with onset this morning. Patient states that every time she urinates it burns. She endorses associated urinary urgency and frequency with decreased stream. Patient also noticed some blood on the toilet tissue when wiping, but states she believes this to be from her vaginal canal. Last menstrual period 2 weeks ago. Patient denies associated fever, shortness of breath, nausea or vomiting, diarrhea, melena or hematochezia, pelvic pain, vaginal discharge, dyspareunia, abdominal pain or flank pain, numbness/tingling, or weakness. Patient states she has a primary care doctor that she can followup with.  Patient is a 32 y.o. female presenting with urinary tract infection. The history is provided by the patient. No language interpreter was used.  Urinary Tract Infection Pertinent negatives include no abdominal pain, fever, nausea or vomiting.    Past Medical History  Diagnosis Date  . Diabetes mellitus without complication   . Anemia   . Gestational diabetes    Past Surgical History  Procedure Laterality Date  . Mouth surgery  2004   Family History  Problem Relation Age of Onset  . Asthma Brother   . Hypertension Maternal Grandmother   . Drug abuse Maternal Grandfather    History  Substance Use Topics  . Smoking status: Never Smoker   . Smokeless tobacco: Not on file  . Alcohol Use: No   OB History   Grav Para Term Preterm Abortions TAB SAB Ect Mult Living   1 1  1      1      Review of Systems  Constitutional: Negative for fever.  Gastrointestinal: Negative for nausea, vomiting,  abdominal pain and diarrhea.  Genitourinary: Positive for dysuria, urgency, frequency and vaginal bleeding. Negative for flank pain and pelvic pain.  All other systems reviewed and are negative.    Allergies  Latex  Home Medications   Current Outpatient Rx  Name  Route  Sig  Dispense  Refill  . cephALEXin (KEFLEX) 500 MG capsule   Oral   Take 1 capsule (500 mg total) by mouth 2 (two) times daily.   14 capsule   0    BP 139/94  Pulse 93  Temp(Src) 97.7 F (36.5 C) (Oral)  Resp 20  SpO2 98%  Physical Exam  Nursing note and vitals reviewed. Constitutional: She is oriented to person, place, and time. She appears well-developed and well-nourished. No distress.  HENT:  Head: Normocephalic and atraumatic.  Eyes: Conjunctivae and EOM are normal. No scleral icterus.  Neck: Normal range of motion.  Cardiovascular: Normal rate, regular rhythm and normal heart sounds.   Pulmonary/Chest: Effort normal and breath sounds normal. No respiratory distress. She has no wheezes. She has no rales.  Abdominal: Soft. She exhibits no distension. There is no tenderness. There is no rebound and no guarding.  No suprapubic or CVA tenderness. No peritoneal signs or guarding.  Musculoskeletal: Normal range of motion.  Neurological: She is alert and oriented to person, place, and time.  Skin: Skin is warm and dry. No rash noted. She is not diaphoretic. No erythema. No pallor.  Psychiatric: She has a normal mood and affect. Her behavior is normal.    ED Course  Procedures (including critical care time) Labs Review Labs Reviewed  URINALYSIS, ROUTINE W REFLEX MICROSCOPIC - Abnormal; Notable for the following:    APPearance TURBID (*)    Specific Gravity, Urine 1.034 (*)    Hgb urine dipstick LARGE (*)    Protein, ur 100 (*)    Leukocytes, UA LARGE (*)    All other components within normal limits  URINE MICROSCOPIC-ADD ON - Abnormal; Notable for the following:    Squamous Epithelial / LPF MANY  (*)    Bacteria, UA FEW (*)    All other components within normal limits  URINE CULTURE  PREGNANCY, URINE   Imaging Review No results found.  EKG Interpretation   None       MDM   1. UTI (urinary tract infection)    Uncomplicated urinary tract infection. Patient presents today for dysuria, urinary urgency, and urinary frequency with decreased stream. Patient well and nontoxic appearing, hemodynamically stable, and afebrile. Physical exam elicits no abdominal or CVA tenderness.  Urine pregnancy negative today and urinalysis suggestive of UTI. Urine culture in process. Patient stable and appropriate for discharge with primary care followup. Will put patient on course of Keflex for symptoms. Return precautions advised and patient agreeable to plan with no unaddressed concerns.    Antony Madura, PA-C 07/04/13 1645

## 2013-07-04 NOTE — ED Notes (Signed)
Pt reports onset urinary urgency, hematuria, and dysuria onset this am

## 2013-07-05 NOTE — ED Provider Notes (Signed)
Medical screening examination/treatment/procedure(s) were performed by non-physician practitioner and as supervising physician I was immediately available for consultation/collaboration.  EKG Interpretation   None        Juliet Rude. Rubin Payor, MD 07/05/13 2253

## 2013-07-06 LAB — URINE CULTURE

## 2013-07-09 ENCOUNTER — Telehealth (HOSPITAL_COMMUNITY): Payer: Self-pay | Admitting: Emergency Medicine

## 2013-07-09 NOTE — ED Notes (Signed)
Post ED Visit - Positive Culture Follow-up ° °Culture report reviewed by antimicrobial stewardship pharmacist: °[] Wes Dulaney, Pharm.D., BCPS °[x] Jeremy Frens, Pharm.D., BCPS °[] Elizabeth Martin, Pharm.D., BCPS °[] Minh Pham, Pharm.D., BCPS, AAHIVP °[] Michelle Turner, Pharm.D., BCPS, AAHIVP ° °Positive urine culture °Treated with Keflex, organism sensitive to the same and no further patient follow-up is required at this time. ° °Amber Davenport °07/09/2013, 9:58 AM ° ° °

## 2013-07-29 DIAGNOSIS — I499 Cardiac arrhythmia, unspecified: Secondary | ICD-10-CM

## 2013-07-29 HISTORY — DX: Cardiac arrhythmia, unspecified: I49.9

## 2014-02-09 LAB — OB RESULTS CONSOLE GC/CHLAMYDIA
Chlamydia: NEGATIVE
Gonorrhea: NEGATIVE

## 2014-02-28 LAB — OB RESULTS CONSOLE ABO/RH: RH TYPE: POSITIVE

## 2014-02-28 LAB — OB RESULTS CONSOLE HIV ANTIBODY (ROUTINE TESTING): HIV: NONREACTIVE

## 2014-02-28 LAB — OB RESULTS CONSOLE RUBELLA ANTIBODY, IGM: RUBELLA: IMMUNE

## 2014-02-28 LAB — OB RESULTS CONSOLE RPR: RPR: NONREACTIVE

## 2014-02-28 LAB — OB RESULTS CONSOLE ANTIBODY SCREEN: Antibody Screen: NEGATIVE

## 2014-02-28 LAB — OB RESULTS CONSOLE HEPATITIS B SURFACE ANTIGEN: HEP B S AG: NEGATIVE

## 2014-05-28 ENCOUNTER — Telehealth: Payer: Self-pay | Admitting: Obstetrics and Gynecology

## 2014-05-28 NOTE — Telephone Encounter (Signed)
TC from patient of Dr. Georgeanna LeaVarnado--5 1/2 months, now with nasal congestion and scratchy throat.  No fever or cough.  Older child has had cold with same sx.  Comfort measures reviewed, may take regular Sudafed, throat lozenges, Tylenol/Advil, Vick's Vapo Rub.  To f/u with Dr. Dion BodyVarnado at visit this week, and to f/u with [facility] prn.  Nigel BridgemanVicki Jeyla Bulger, CNM 05/28/14 1p

## 2014-05-28 NOTE — Telephone Encounter (Signed)
Entered in error

## 2014-05-30 ENCOUNTER — Encounter (HOSPITAL_COMMUNITY): Payer: Self-pay | Admitting: Emergency Medicine

## 2014-06-28 LAB — OB RESULTS CONSOLE HIV ANTIBODY (ROUTINE TESTING): HIV: NONREACTIVE

## 2014-07-21 ENCOUNTER — Other Ambulatory Visit: Payer: Self-pay | Admitting: Obstetrics and Gynecology

## 2014-07-21 DIAGNOSIS — O09213 Supervision of pregnancy with history of pre-term labor, third trimester: Secondary | ICD-10-CM

## 2014-07-25 ENCOUNTER — Inpatient Hospital Stay (HOSPITAL_COMMUNITY)
Admission: AD | Admit: 2014-07-25 | Discharge: 2014-07-25 | Disposition: A | Discharge status: Home / Self Care | Payer: 59 | Source: Ambulatory Visit | Attending: Obstetrics & Gynecology | Admitting: Obstetrics & Gynecology

## 2014-07-25 ENCOUNTER — Encounter (HOSPITAL_COMMUNITY): Payer: Self-pay | Admitting: *Deleted

## 2014-07-25 DIAGNOSIS — O99513 Diseases of the respiratory system complicating pregnancy, third trimester: Secondary | ICD-10-CM | POA: Diagnosis not present

## 2014-07-25 DIAGNOSIS — Z3A31 31 weeks gestation of pregnancy: Secondary | ICD-10-CM | POA: Insufficient documentation

## 2014-07-25 DIAGNOSIS — I471 Supraventricular tachycardia: Secondary | ICD-10-CM | POA: Insufficient documentation

## 2014-07-25 DIAGNOSIS — R000 Tachycardia, unspecified: Secondary | ICD-10-CM | POA: Diagnosis present

## 2014-07-25 DIAGNOSIS — Z0189 Encounter for other specified special examinations: Secondary | ICD-10-CM

## 2014-07-25 LAB — CBC
HEMATOCRIT: 35.3 % — AB (ref 36.0–46.0)
HEMOGLOBIN: 11.6 g/dL — AB (ref 12.0–15.0)
MCH: 27.9 pg (ref 26.0–34.0)
MCHC: 32.9 g/dL (ref 30.0–36.0)
MCV: 84.9 fL (ref 78.0–100.0)
Platelets: 157 10*3/uL (ref 150–400)
RBC: 4.16 MIL/uL (ref 3.87–5.11)
RDW: 14 % (ref 11.5–15.5)
WBC: 11.4 10*3/uL — ABNORMAL HIGH (ref 4.0–10.5)

## 2014-07-25 LAB — COMPREHENSIVE METABOLIC PANEL
ALT: 18 U/L (ref 0–35)
AST: 26 U/L (ref 0–37)
Albumin: 3.4 g/dL — ABNORMAL LOW (ref 3.5–5.2)
Alkaline Phosphatase: 56 U/L (ref 39–117)
Anion gap: 10 (ref 5–15)
BILIRUBIN TOTAL: 0.2 mg/dL — AB (ref 0.3–1.2)
BUN: 8 mg/dL (ref 6–23)
CALCIUM: 9.4 mg/dL (ref 8.4–10.5)
CHLORIDE: 104 meq/L (ref 96–112)
CO2: 22 mmol/L (ref 19–32)
CREATININE: 0.51 mg/dL (ref 0.50–1.10)
GLUCOSE: 104 mg/dL — AB (ref 70–99)
Potassium: 3.8 mmol/L (ref 3.5–5.1)
Sodium: 136 mmol/L (ref 135–145)
Total Protein: 6.5 g/dL (ref 6.0–8.3)

## 2014-07-25 LAB — TSH: TSH: 2.553 u[IU]/mL (ref 0.350–4.500)

## 2014-07-25 LAB — GLUCOSE, CAPILLARY: Glucose-Capillary: 93 mg/dL (ref 70–99)

## 2014-07-25 MED ORDER — ADENOSINE 6 MG/2ML IV SOLN
6.0000 mg | Freq: Once | INTRAVENOUS | Status: AC
  Administered 2014-07-25: 6 mg via INTRAVENOUS
  Filled 2014-07-25: qty 2

## 2014-07-25 MED ORDER — METOPROLOL TARTRATE 1 MG/ML IV SOLN
5.0000 mg | Freq: Once | INTRAVENOUS | Status: DC
  Filled 2014-07-25: qty 5

## 2014-07-25 MED ORDER — ADENOSINE 12 MG/4ML IV SOLN
12.0000 mg | Freq: Once | INTRAVENOUS | Status: AC
  Administered 2014-07-25: 12 mg via INTRAVENOUS
  Filled 2014-07-25: qty 4

## 2014-07-25 NOTE — MAU Note (Signed)
Dr Royann Shiversroitoru in with patient discussing plan

## 2014-07-25 NOTE — Consult Note (Signed)
Reason for Consult: SVT  Requesting Physician: Dion BodyVarnado  Cardiologist: None  HPI: This is a 33 y.o. female with a past medical history significant for gestational diabetes and anemia, now [redacted] weeks pregnant with her second child. Had very abrupt onset of rapid palpitations, but no angina, minimal dyspnea, no presyncope.  ECG shows SVT 160 bpm with no discernible P wave, maybe pseudor' in V1, c/w AVNRT. Valsalva maneuver did not work. Carotid sinus compression by me unsuccessful. Adenosine 6 mg IV slightly lengthened cycle length without stopping the SVT. Returned to NSR after adenosine 12 mg IV.  Occasional isolated ectopy in the past, no previous sustained arrhythmia.  PMHx:  Past Medical History  Diagnosis Date  . Diabetes mellitus without complication   . Anemia   . Gestational diabetes    Past Surgical History  Procedure Laterality Date  . Mouth surgery  2004    FAMHx: Family History  Problem Relation Age of Onset  . Asthma Brother   . Hypertension Maternal Grandmother   . Drug abuse Maternal Grandfather     SOCHx:  reports that she has never smoked. She does not have any smokeless tobacco history on file. She reports that she does not drink alcohol or use illicit drugs.  ALLERGIES: Allergies  Allergen Reactions  . Latex Itching    ROS: She reports good fetal movement, denies abdominal cramping/contractions, LOF, vaginal bleeding, vaginal itching/burning, urinary symptoms, h/a, dizziness, n/v, or fever/chills.  The patient specifically denies any chest pain at rest or exertion, dyspnea at rest or with exertion, orthopnea, paroxysmal nocturnal dyspnea, syncope, palpitations, focal neurological deficits, intermittent claudication, lower extremity edema, unexplained weight gain, cough, hemoptysis or wheezing.   HOME MEDICATIONS:  Prior to Admission:  Prescriptions prior to admission  Medication Sig Dispense Refill Last Dose  . Prenatal Vit-Fe  Fumarate-FA (PRENATAL MULTIVITAMIN) TABS tablet Take 1 tablet by mouth daily at 12 noon.   Past Week at Unknown time  . cephALEXin (KEFLEX) 500 MG capsule Take 1 capsule (500 mg total) by mouth 2 (two) times daily. (Patient not taking: Reported on 07/25/2014) 14 capsule 0     VITALS: Blood pressure 133/81, pulse 158, temperature 98.1 F (36.7 C), temperature source Oral, resp. rate 20, SpO2 100 %, unknown if currently breastfeeding.  PHYSICAL EXAM:  General: Alert, oriented x3, no distress Head: no evidence of trauma, PERRL, EOMI, no exophtalmos or lid lag, no myxedema, no xanthelasma; normal ears, nose and oropharynx Neck: normal jugular venous pulsations and no hepatojugular reflux; brisk carotid pulses without delay and no carotid bruits Chest: clear to auscultation, no signs of consolidation by percussion or palpation, normal fremitus, symmetrical and full respiratory excursions Cardiovascular: normal position and quality of the apical impulse, regular rhythm, normal first heart sound and normal second heart sound, no rubs or gallops, no murmur Abdomen: 3rd trimester gravid abdomen, no tenderness or distention, no masses by palpation, no abnormal pulsatility or arterial bruits, normal bowel sounds. Extremities: no clubbing, cyanosis;  no edema; 2+ radial, ulnar and brachial pulses bilaterally; 2+ right femoral, posterior tibial and dorsalis pedis pulses; 2+ left femoral, posterior tibial and dorsalis pedis pulses; no subclavian or femoral bruits Neurological: grossly nonfocal   LABS  CBC  Recent Labs  07/25/14 1645  WBC 11.4*  HGB 11.6*  HCT 35.3*  MCV 84.9  PLT 157   Basic Metabolic Panel  Recent Labs  07/25/14 1645  NA 136  K 3.8  CL 104  CO2 22  GLUCOSE  104*  BUN 8  CREATININE 0.51  CALCIUM 9.4   Liver Function Tests  Recent Labs  07/25/14 1645  AST 26  ALT 18  ALKPHOS 56  BILITOT 0.2*  PROT 6.5  ALBUMIN 3.4*   IMAGING: No results  found.  ECG: SVT, probably AVNRT, otherwise normal  IMPRESSION: 1. 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 Since this is her only symptomatic event by age 33, likely to be a low incidence problem. Small risk to fetal development of using beta blockers/calcium channel blockers is probably not justified unless frequent events. Discussed Valsalva maneuver and diving reflex as options to interrupt arrhythmia before seeking medical attention for episodes > 30 min. Will arrange f/u after expected delivery date.  Time Spent Directly with Patient: 60 minutes  Thurmon FairMihai Kariya Lavergne, MD, [facility] [phone] office [phone] pager   07/25/2014, 6:26 PM

## 2014-07-25 NOTE — MAU Provider Note (Signed)
Chief Complaint:  Tachycardia   None     HPI: Amber Davenport is a 33 y.o. G2P0101 at 48w2dho presents to maternity admissions reporting fast heartbeat.  She reports she was at work around 3 pm and drank a glass of water, then started feeling her heart race. She reports some mild shortness ob breath but denies chest pain.  She reports good fetal movement, denies abdominal cramping/contractions, LOF, vaginal bleeding, vaginal itching/burning, urinary symptoms, h/a, dizziness, n/v, or fever/chills.     Past Medical History: Past Medical History  Diagnosis Date  . Diabetes mellitus without complication   . Anemia   . Gestational diabetes     Past obstetric history: OB History  Gravida Para Term Preterm AB SAB TAB Ectopic Multiple Living  2 1  1      1     # Outcome Date GA Lbr Len/2nd Weight Sex Delivery Anes PTL Lv  2 Current           1 Preterm 01/29/13 389w4d1:05 / 00:22 1.37 kg (3 lb 0.3 oz) F Vag-Spont None  Y      Past Surgical History: Past Surgical History  Procedure Laterality Date  . Mouth surgery  2004    Family History: Family History  Problem Relation Age of Onset  . Asthma Brother   . Hypertension Maternal Grandmother   . Drug abuse Maternal Grandfather     Social History: History  Substance Use Topics  . Smoking status: Never Smoker   . Smokeless tobacco: Not on file  . Alcohol Use: No    Allergies:  Allergies  Allergen Reactions  . Latex Itching    Meds:  No prescriptions prior to admission    ROS: Pertinent findings in history of present illness.  Physical Exam  Blood pressure 138/87, pulse 114, temperature 98.1 F (36.7 C), temperature source Oral, resp. rate 16, SpO2 100 %, unknown if currently breastfeeding. GENERAL: Well-developed, well-nourished female in no acute distress.  HEENT: normocephalic HEART: normal rate RESP: normal effort ABDOMEN: Soft, non-tender, gravid appropriate for gestational age EXTREMITIES: Nontender, no  edema NEURO: alert and oriented    FHT:  Baseline 145, moderate variability, accelerations present, no decelerations Contractions: none on toco or to palpation   Labs:  Results for orders placed or performed during the hospital encounter of 07/25/14 (from the past 72 hour(s))  Glucose, capillary     Status: None   Collection Time: 07/25/14  4:44 PM  Result Value Ref Range   Glucose-Capillary 93 70 - 99 mg/dL  TSH     Status: None   Collection Time: 07/25/14  4:45 PM  Result Value Ref Range   TSH 2.553 0.350 - 4.500 uIU/mL    Comment[facility]     Status: Abnormal   Collection Time: 07/25/14  4:45 PM  Result Value Ref Range   WBC 11.4 (H) 4.0 - 10.5 K/uL   RBC 4.16 3.87 - 5.11 MIL/uL   Hemoglobin 11.6 (L) 12.0 - 15.0 g/dL   HCT 35.3 (L) 36.0 - 46.0 %   MCV 84.9 78.0 - 100.0 fL   MCH 27.9 26.0 - 34.0 pg   MCHC 32.9 30.0 - 36.0 g/dL   RDW 14.0 11.5 - 15.5 %   Platelets 157 150 - 400 K/uL  Comprehensive metabolic panel     Status: Abnormal   Collection Time: 07/25/14  4:45 PM  Result Value Ref Range   Sodium 136 135 - 145 mmol/L  Comment: Please note change in reference range.   Potassium 3.8 3.5 - 5.1 mmol/L    Comment: Please note change in reference range.   Chloride 104 96 - 112 mEq/L   CO2 22 19 - 32 mmol/L   Glucose, Bld 104 (H) 70 - 99 mg/dL   BUN 8 6 - 23 mg/dL   Creatinine, Ser 0.51 0.50 - 1.10 mg/dL   Calcium 9.4 8.4 - 10.5 mg/dL   Total Protein 6.5 6.0 - 8.3 g/dL   Albumin 3.4 (L) 3.5 - 5.2 g/dL   AST 26 0 - 37 U/L   ALT 18 0 - 35 U/L   Alkaline Phosphatase 56 39 - 117 U/L   Total Bilirubin 0.2 (L) 0.3 - 1.2 mg/dL   GFR calc non Af Amer >90 >90 mL/min   GFR calc Af Amer >90 >90 mL/min    Comment: (NOTE) The eGFR has been calculated using the CKD EPI equation. This calculation has not been validated in all clinical situations. eGFR's persistently <90 mL/min signify possible Chronic Kidney Disease.    Anion gap 10 5 - 15     ED Course CBC, CMP, LR bolus, continuous pulse oximeter, EKG Vasovagal maneuver x2 without response  Sanda Klein, MD, [facility] from [place] to bedside for cardioversion.  See notes in Epic.  Successful AVNRT converted to NSR with iv adenosine.  Pt tolerated procedure well.   Assessment: 1. SVT (supraventricular tachycardia)   2. Encounter for cardioversion procedure   3. AVNRT (AV nodal re-entry tachycardia)   4.  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 See notes from Dr Sallyanne Kuster in [place]. D/C home with follow up plan with cardiology after delivery      Follow-up Information    Follow up with Thurnell Lose, MD.   Specialty:  Obstetrics and Gynecology   Why:  As scheduled   Contact information:   [name], [name] [address] [phone]       Follow up with [facility].   Why:  As needed for emergencies   Contact information:   [address] [id number] [place] [phone]       Medication List    STOP taking these medications        cephALEXin 500 MG capsule  Commonly known as:  KEFLEX      TAKE these medications        prenatal multivitamin Tabs tablet  Take 1 tablet by mouth daily at 12 noon.        Fatima Blank Certified Nurse-Midwife 07/27/2014 9:26 AM

## 2014-07-25 NOTE — MAU Note (Signed)
Was sitting at desk, took a drink of water, heart started racing.  Walked to co-workers desk- sensation continued.  Called dr's office.  Was told to put ice on pressure points. Used restroom, still racing. Sat back down. Talked to nurse and dr, was instructed to come here. No shortness of breath. Felt like she was getting punched in throat when first happened- no longer feeling that.

## 2014-07-25 NOTE — Discharge Instructions (Signed)
Supraventricular Tachycardia °Supraventricular tachycardia (SVT) is an abnormal heart rhythm (arrhythmia) that causes the heart to beat very fast (tachycardia). This kind of fast heartbeat originates in the upper chambers of the heart (atria). SVT can cause the heart to beat greater than 100 beats per minute. SVT can have a rapid burst of heartbeats. This can start and stop suddenly without warning and is called nonsustained. SVT can also be sustained, in which the heart beats at a continuous fast rate.  °CAUSES  °There can be different causes of SVT. Some of these include: °· Heart valve problems such as mitral valve prolapse. °· An enlarged heart (hypertrophic cardiomyopathy). °· Congenital heart problems. °· Heart inflammation (pericarditis). °· Hyperthyroidism. °· Low potassium or magnesium levels. °· Caffeine. °· Drug use such as cocaine, methamphetamines, or stimulants. °· Some over-the-counter medicines such as: °¨ Decongestants. °¨ Diet medicines. °¨ Herbal medicines. °SYMPTOMS  °Symptoms of SVT can vary. Symptoms depend on whether the SVT is sustained or nonsustained. You may experience: °· No symptoms (asymptomatic). °· An awareness of your heart beating rapidly (palpitations). °· Shortness of breath. °· Chest pain or pressure. °If your blood pressure drops because of the SVT, you may experience: °· Fainting or near fainting. °· Weakness. °· Dizziness. °DIAGNOSIS  °Different tests can be performed to diagnose SVT, such as: °· An electrocardiogram (EKG). This is a painless test that records the electrical activity of your heart. °· Holter monitor. This is a 24 hour recording of your heart rhythm. You will be given a diary. Write down all symptoms that you have and what you were doing at the time you experienced symptoms. °· Arrhythmia monitor. This is a small device that your wear for several weeks. It records the heart rhythm when you have symptoms. °· Echocardiogram. This is an imaging test to help detect  abnormal heart structure such as congenital abnormalities, heart valve problems, or heart enlargement. °· Stress test. This test can help determine if the SVT is related to exercise. °· Electrophysiology study (EPS). This is a procedure that evaluates your heart's electrical system and can help your caregiver find the cause of your SVT. °TREATMENT  °Treatment of SVT depends on the symptoms, how often it recurs, and whether there are any underlying heart problems.  °· If symptoms are rare and no other cardiac disease is present, no treatment may be needed. °· Blood work may be done to check potassium, magnesium, and thyroid hormone levels to see if they are abnormal. If these levels are abnormal, treatment to correct the problems will occur. °Medicines °Your caregiver may use oral medicines to treat SVT. These medicines are given for long-term control of SVT. Medicines may be used alone or in combination with other treatments. These medicines work to slow nerve impulses in the heart muscle. These medicines can also be used to treat high blood pressure. Some of these medicines may include: °· Calcium channel blockers. °· Beta blockers. °· Digoxin. °Nonsurgical procedures °Nonsurgical techniques may be used if oral medicines do not work. Some examples include: °· Cardioversion. This technique uses either drugs or an electrical shock to restore a normal heart rhythm. °¨ Cardioversion drugs may be given through an intravenous (IV) line to help "reset" the heart rhythm. °¨ In electrical cardioversion, the caregiver shocks your heart to stop its beat for a split second. This helps to reset the heart to a normal rhythm. °· Ablation. This procedure is done under mild sedation. High frequency radio wave energy is used to   destroy the area of heart tissue responsible for the SVT. °HOME CARE INSTRUCTIONS  °· Do not smoke. °· Only take medicines prescribed by your caregiver. Check with your caregiver before using over-the-counter  medicines. °· Check with your caregiver about how much alcohol and caffeine (coffee, tea, colas, or chocolate) you may have. °· It is very important to keep all follow-up referrals and appointments in order to properly manage this problem. °SEEK IMMEDIATE MEDICAL CARE IF: °· You have dizziness. °· You faint or nearly faint. °· You have shortness of breath. °· You have chest pain or pressure. °· You have sudden nausea or vomiting. °· You have profuse sweating. °· You are concerned about how long your symptoms last. °· You are concerned about the frequency of your SVT episodes. °If you have the above symptoms, call your local emergency services (911 in [place] immediately. Do not drive yourself to the hospital. °MAKE SURE YOU:  °· Understand these instructions. °· Will watch your condition. °· Will get help right away if you are not doing well or get worse. °Document Released: 07/15/2005 Document Revised: 10/07/2011 Document Reviewed: 10/27/2008 °ExitCare® Patient Information ©2015 [employer]. This information is not intended to replace advice given to you by your health care provider. Make sure you discuss any questions you have with your health care provider. ° °

## 2014-07-25 NOTE — Significant Event (Signed)
Rapid Response Event Note  Overview: Called to MAU to assist cardiologist with chemical cardioversion of pregnant pt in SVT @160  per EKG      Initial Focused Assessment: Pt alert, oriented, "I feel fine" w/HR in the 160's.   Interventions: Adensoine 6mg  IV rapid push, followed by 12 mg - pt converted to ST of 110-120 after brief pause w/PVC's   Event Summary: Pt was successfully cardioverted & sent home. To follow-up with cardiologist after delivery of infant.   at      at          [facility], [facility]

## 2014-07-26 ENCOUNTER — Telehealth: Payer: Self-pay | Admitting: Cardiovascular Disease

## 2014-07-26 NOTE — Telephone Encounter (Signed)
Closed encounter °

## 2014-07-28 ENCOUNTER — Other Ambulatory Visit: Payer: Self-pay | Admitting: Obstetrics and Gynecology

## 2014-07-28 ENCOUNTER — Ambulatory Visit (HOSPITAL_COMMUNITY)
Admission: RE | Admit: 2014-07-28 | Discharge: 2014-07-28 | Disposition: A | Discharge status: Home / Self Care | Payer: 59 | Source: Ambulatory Visit | Attending: Obstetrics and Gynecology | Admitting: Obstetrics and Gynecology

## 2014-07-28 DIAGNOSIS — O24419 Gestational diabetes mellitus in pregnancy, unspecified control: Secondary | ICD-10-CM | POA: Insufficient documentation

## 2014-07-28 DIAGNOSIS — Z8751 Personal history of pre-term labor: Secondary | ICD-10-CM | POA: Diagnosis not present

## 2014-07-28 DIAGNOSIS — O26873 Cervical shortening, third trimester: Secondary | ICD-10-CM | POA: Diagnosis present

## 2014-07-28 DIAGNOSIS — O09213 Supervision of pregnancy with history of pre-term labor, third trimester: Secondary | ICD-10-CM

## 2014-07-28 DIAGNOSIS — Z3A31 31 weeks gestation of pregnancy: Secondary | ICD-10-CM | POA: Diagnosis not present

## 2014-07-29 DIAGNOSIS — I471 Supraventricular tachycardia, unspecified: Secondary | ICD-10-CM

## 2014-07-29 HISTORY — DX: Supraventricular tachycardia, unspecified: I47.10

## 2014-07-29 HISTORY — DX: Supraventricular tachycardia: I47.1

## 2014-07-29 NOTE — L&D Delivery Note (Signed)
34 y/o G3P0111 @ 3547w1d estimated gestational age who presented in active labor and noted to be complete on admission. She was managed expectantly.  Pregnancy complicated by: 1) h/o PPROM @ 27wk  s/p 17-P weekly 2) short cervix- US @ 31wk showed dynamic cervix 1.4cm-2.3cm with funneling pt has been on bedrest 3) GDMA2- Glyburide increased to 2.5mg  in am and 1.25mg  in pm 4) UTI- s/p tx with Keflex, TOC negative 5) GBS positive 6) Allergies: LATEX  Delivery Note At 6:01 AM a viable female was delivered via Vaginal, Spontaneous Delivery (Presentation: Right Occiput Anterior).  APGAR: 9, 9; weight  .   Placenta status: Intact, Spontaneous.  Cord: 3 vessels with the following complications: None.  Cord pH: not collected  Anesthesia: Local  Episiotomy: None Lacerations: 2nd degree Suture Repair: 2.0 3.0 vicryl Est. Blood Loss (mL):  350cc  Mom to postpartum.  Baby to Couplet care / Skin to Skin.  Myna HidalgoZAN, Mantaj Chamberlin, M 09/11/2014, 6:52 AM

## 2014-07-30 DIAGNOSIS — O09219 Supervision of pregnancy with history of pre-term labor, unspecified trimester: Secondary | ICD-10-CM | POA: Insufficient documentation

## 2014-08-04 ENCOUNTER — Other Ambulatory Visit: Payer: Self-pay | Admitting: Obstetrics and Gynecology

## 2014-08-04 ENCOUNTER — Inpatient Hospital Stay (HOSPITAL_COMMUNITY)
Admission: AD | Admit: 2014-08-04 | Discharge: 2014-08-04 | Disposition: A | Discharge status: Home / Self Care | Payer: 59 | Source: Ambulatory Visit | Attending: Obstetrics and Gynecology | Admitting: Obstetrics and Gynecology

## 2014-08-04 DIAGNOSIS — O2343 Unspecified infection of urinary tract in pregnancy, third trimester: Secondary | ICD-10-CM

## 2014-08-04 DIAGNOSIS — O4703 False labor before 37 completed weeks of gestation, third trimester: Secondary | ICD-10-CM

## 2014-08-04 DIAGNOSIS — Z3A32 32 weeks gestation of pregnancy: Secondary | ICD-10-CM | POA: Insufficient documentation

## 2014-08-04 LAB — URINALYSIS, ROUTINE W REFLEX MICROSCOPIC
Bilirubin Urine: NEGATIVE
Glucose, UA: NEGATIVE mg/dL
Ketones, ur: 15 mg/dL — AB
NITRITE: POSITIVE — AB
Protein, ur: NEGATIVE mg/dL
Specific Gravity, Urine: >1.03 — ABNORMAL HIGH (ref 1.005–1.030)
UROBILINOGEN UA: 0.2 mg/dL (ref 0.0–1.0)
pH: 6.5 (ref 5.0–8.0)

## 2014-08-04 LAB — URINE MICROSCOPIC-ADD ON

## 2014-08-04 LAB — FETAL FIBRONECTIN: FETAL FIBRONECTIN: NEGATIVE

## 2014-08-04 MED ORDER — SODIUM CHLORIDE 0.9 % IV SOLN
2.0000 g | Freq: Once | INTRAVENOUS | Status: AC
  Administered 2014-08-04: 2 g via INTRAVENOUS
  Filled 2014-08-04: qty 2000

## 2014-08-04 MED ORDER — LACTATED RINGERS IV BOLUS (SEPSIS)
1000.0000 mL | Freq: Once | INTRAVENOUS | Status: AC
  Administered 2014-08-04: 1000 mL via INTRAVENOUS

## 2014-08-04 MED ORDER — NITROFURANTOIN MONOHYD MACRO 100 MG PO CAPS: 100.0000 mg | ORAL_CAPSULE | Freq: Two times a day (BID) | ORAL | Status: DC

## 2014-08-04 NOTE — MAU Provider Note (Signed)
History     CSN: [id number]  Arrival date and time: 08/04/14 1353   First Provider Initiated Contact with Patient 08/04/14 1504      Chief Complaint  Patient presents with  . short cervix    HPI  [name] is a 34 y.o. G2P0101 at 8659w5d with a history of preterm delivery with previous pregnancy who presents to MAU today from the office for further evaluation of preterm contractions. She was 1-2 cm in the office today and sent to MAU with FFN. She states occasional contractions with mild pain with contractions only. Otherwise she denies abdominal pain, vaginal bleeding, discharge, LOF, UTI symptoms, N/V/D, fever or flank pain. She reports good fetal movement.   OB History    Gravida Para Term Preterm AB TAB SAB Ectopic Multiple Living   2 1  1      1       Past Medical History  Diagnosis Date  . Diabetes mellitus without complication   . Anemia   . Gestational diabetes     Past Surgical History  Procedure Laterality Date  . Mouth surgery  2004    Family History  Problem Relation Age of Onset  . Asthma Brother   . Hypertension Maternal Grandmother   . Drug abuse Maternal Grandfather     History  Substance Use Topics  . Smoking status: Never Smoker   . Smokeless tobacco: Not on file  . Alcohol Use: No    Allergies:  Allergies  Allergen Reactions  . Latex Itching    Prescriptions prior to admission  Medication Sig Dispense Refill Last Dose  . Prenatal Vit-Fe Fumarate-FA (PRENATAL MULTIVITAMIN) TABS tablet Take 1 tablet by mouth daily at 12 noon.   08/03/2014 at Unknown time    Review of Systems  Constitutional: Negative for malaise/fatigue.  Gastrointestinal: Negative for nausea, vomiting, abdominal pain and diarrhea.  Genitourinary: Negative for dysuria, urgency and frequency.       Neg - vaginal bleeding, discharge, LOF   Physical Exam   Blood pressure 121/74, pulse 91, temperature 98.1 F (36.7 C), temperature source Oral, resp. rate 18,  unknown if currently breastfeeding.  Physical Exam  Constitutional: She appears well-developed and well-nourished. No distress.  HENT:  Head: Normocephalic.  Cardiovascular: Normal rate.   Respiratory: Effort normal.  GI: Soft. She exhibits no distension and no mass. There is no tenderness. There is no rebound, no guarding and no CVA tenderness.  Neurological: She is alert.  Skin: Skin is warm and dry. No erythema.  Psychiatric: She has a normal mood and affect.   Results for orders placed or performed during the hospital encounter of 08/04/14 (from the past 24 hour(s))  Fetal fibronectin     Status: None   Collection Time: 08/04/14 12:30 PM  Result Value Ref Range   Fetal Fibronectin NEGATIVE NEGATIVE  Urinalysis, Routine w reflex microscopic     Status: Abnormal   Collection Time: 08/04/14  2:38 PM  Result Value Ref Range   Color, Urine YELLOW YELLOW   APPearance HAZY (A) CLEAR   Specific Gravity, Urine >1.030 (H) 1.005 - 1.030   pH 6.5 5.0 - 8.0   Glucose, UA NEGATIVE NEGATIVE mg/dL   Hgb urine dipstick SMALL (A) NEGATIVE   Bilirubin Urine NEGATIVE NEGATIVE   Ketones, ur 15 (A) NEGATIVE mg/dL   Protein, ur NEGATIVE NEGATIVE mg/dL   Urobilinogen, UA 0.2 0.0 - 1.0 mg/dL   Nitrite POSITIVE (A) NEGATIVE   Leukocytes, UA SMALL (A) NEGATIVE  Urine microscopic-add on     Status: Abnormal   Collection Time: 08/04/14  2:38 PM  Result Value Ref Range   Squamous Epithelial / LPF MANY (A) RARE   WBC, UA 11-20 <3 WBC/hpf   RBC / HPF 3-6 <3 RBC/hpf   Bacteria, UA MANY (A) RARE   Fetal Monitoring: Baseline: 135 bpm, moderate variability, + accelerations, no decelerations Contractions: irregular  MAU Course  Procedures  None  MDM Discussed with Dr. Dion Body. Recommends 1 liter bolus LR, 2G IV ampicillin once while in MAU and continued monitoring 1706 - patient receiving IV fluids and continued EFM. Care turned over to Nataliee Shurtz, CNM  Marny Lowenstein, PA-C  08/04/2014, 5:05 PM    1810: UCs diminished. Declined BMZ. D/W Dr. Dion Body D/C home on macrobid Assessment and Plan   1. UTI (urinary tract infection) during pregnancy, third trimester   2. Preterm contractions, third trimester   G2P0101 at [redacted]w[redacted]d Reactive FHR tracing    Medication List    TAKE these medications        nitrofurantoin (macrocrystal-monohydrate) 100 MG capsule  Commonly known as:  MACROBID  Take 1 capsule (100 mg total) by mouth 2 (two) times daily.     prenatal multivitamin Tabs tablet  Take 1 tablet by mouth daily at 12 noon.       Follow-up Information    Follow up with [name], MD.   Specialty:  Obstetrics and Gynecology   Why:  Keep your scheduled prenatal appointment   Contact information:   [address], [address] [place] [address] [phone]

## 2014-08-04 NOTE — MAU Note (Signed)
Pt sent from MD office, hx of PTD @ 27 weeks, short cevix, SVE today 1-2 cm's, FFN collected in office - pt has it with her.  Pt was feeling cramping 3 nights ago, not so much today.  Denies bleeding or LOF.

## 2014-08-04 NOTE — Discharge Instructions (Signed)

## 2014-08-04 NOTE — MAU Note (Signed)
Pt had cardioversion in MAU last week, denies palpitations today.

## 2014-08-06 LAB — CULTURE, OB URINE
Colony Count: >=100000
Special Requests: NORMAL

## 2014-08-10 ENCOUNTER — Inpatient Hospital Stay (HOSPITAL_COMMUNITY)
Admission: AD | Admit: 2014-08-10 | Discharge: 2014-08-10 | Disposition: A | Discharge status: Home / Self Care | Payer: 59 | Source: Ambulatory Visit | Attending: Obstetrics & Gynecology | Admitting: Obstetrics & Gynecology

## 2014-08-10 ENCOUNTER — Encounter (HOSPITAL_COMMUNITY): Payer: Self-pay | Admitting: *Deleted

## 2014-08-10 DIAGNOSIS — O9989 Other specified diseases and conditions complicating pregnancy, childbirth and the puerperium: Secondary | ICD-10-CM | POA: Insufficient documentation

## 2014-08-10 DIAGNOSIS — I471 Supraventricular tachycardia: Secondary | ICD-10-CM | POA: Diagnosis not present

## 2014-08-10 DIAGNOSIS — Z3A33 33 weeks gestation of pregnancy: Secondary | ICD-10-CM | POA: Insufficient documentation

## 2014-08-10 LAB — BASIC METABOLIC PANEL WITH GFR
Anion gap: 6 (ref 5–15)
BUN: 6 mg/dL (ref 6–23)
CO2: 21 mmol/L (ref 19–32)
Calcium: 8.5 mg/dL (ref 8.4–10.5)
Chloride: 108 meq/L (ref 96–112)
Creatinine, Ser: 0.53 mg/dL (ref 0.50–1.10)
GFR calc Af Amer: >90 mL/min
GFR calc non Af Amer: >90 mL/min
Glucose, Bld: 185 mg/dL — ABNORMAL HIGH (ref 70–99)
Potassium: 3.8 mmol/L (ref 3.5–5.1)
Sodium: 135 mmol/L (ref 135–145)

## 2014-08-10 LAB — MAGNESIUM: Magnesium: 1.6 mg/dL (ref 1.5–2.5)

## 2014-08-10 MED ORDER — ADENOSINE 12 MG/4ML IV SOLN
12.0000 mg | Freq: Once | INTRAVENOUS | Status: DC
  Filled 2014-08-10: qty 4

## 2014-08-10 MED ORDER — ADENOSINE 6 MG/2ML IV SOLN
6.0000 mg | Freq: Once | INTRAVENOUS | Status: AC
  Administered 2014-08-10: 6 mg via INTRAVENOUS
  Filled 2014-08-10: qty 2

## 2014-08-10 MED ORDER — SODIUM CHLORIDE 0.9 % IV SOLN: INTRAVENOUS | Status: DC

## 2014-08-10 MED ORDER — METOPROLOL TARTRATE 25 MG PO TABS: 25.0000 mg | ORAL_TABLET | ORAL | Status: DC | PRN

## 2014-08-10 NOTE — MAU Note (Signed)
Respiratory tech present and EKG in progress.

## 2014-08-10 NOTE — MAU Note (Signed)
EKG monitor off.

## 2014-08-10 NOTE — MAU Note (Signed)
Pt reports she was cardioverted on 12/28, has felt okay until today and began to feel like her heart was racing again.

## 2014-08-10 NOTE — MAU Provider Note (Signed)
CARDIOLOGY CONSULT NOTE  Patient ID: Amber Davenport MRN: [id number] DOB/AGE: [birth year] 31 y.o.  Admit date: 08/10/2014 Reason for Consultation: SVT  HPI: This is a 34 y.o. female with a past medical history significant for gestational diabetes and anemia, now [redacted] weeks pregnant with her second child. Had very abrupt onset of rapid palpitations, but no angina, dyspnea, or presyncope tonight.  Came to [facility].   ECG shows SVT 169 bpm with no discernible P wave, possible AVNRT.  She immersed her head in ice water at home without effect. Carotid sinus compression by me unsuccessful. Adenosine 6 mg IV returned her to NSR with no complication.    Repeat ECG shows sinus tachy 110 with inferiro T wave inversions.   She had a similar admission in 12/15, required 12 mg IV adenosine to return to NSR.  Prior to this episode, no known SVT.   Review of systems complete and found to be negative unless listed above in HPI  Past Medical History: 1. Gestational diabetes 2. Anemia 3. SVT: 1st episode in 12/15, 2nd episode tonight.   Family History  Problem Relation Age of Onset  . Asthma Brother   . Hypertension Maternal Grandmother   . Drug abuse Maternal Grandfather     History   Social History  . Marital Status: Married    Spouse Name: N/A    Number of Children: N/A  . Years of Education: N/A   Occupational History  . Not on file.   Social History Main Topics  . Smoking status: Never Smoker   . Smokeless tobacco: Not on file  . Alcohol Use: No  . Drug Use: No  . Sexual Activity: Yes    Birth Control/ Protection: None   Other Topics Concern  . Not on file   Social History Narrative     Prescriptions prior to admission  Medication Sig Dispense Refill Last Dose  . GLYBURIDE PO Take 1 tablet by mouth daily. Take  by mouth daily.   08/10/2014 at Unknown time  . nitrofurantoin, macrocrystal-monohydrate, (MACROBID) 100 MG capsule Take 1 capsule (100 mg total) by  mouth 2 (two) times daily. 15 capsule 0 08/09/2014 at Unknown time  . Prenatal Vit-Fe Fumarate-FA (PRENATAL MULTIVITAMIN) TABS tablet Take 1 tablet by mouth daily at 12 noon.   08/09/2014 at Unknown time    Physical exam Blood pressure 133/81, pulse 163, temperature 98.2 F (36.8 C), temperature source Oral, resp. rate 18, SpO2 100 %, unknown if currently breastfeeding. General: NAD Neck: No JVD, no thyromegaly or thyroid nodule.  Lungs: Clear to auscultation bilaterally with normal respiratory effort. CV: Nondisplaced PMI.  Heart regular S1/S2, no S3/S4, no murmur.  No peripheral edema.  No carotid bruit.  Normal pedal pulses.  Abdomen: Gravid, soft, nontender.   Skin: Intact without lesions or rashes.  Neurologic: Alert and oriented x 3.  Psych: Normal affect. Extremities: No clubbing or cyanosis.  HEENT: Normal.   Labs:   Lab Results  Component Value Date   WBC 11.4* 07/25/2014   HGB 11.6* 07/25/2014   HCT 35.3* 07/25/2014   MCV 84.9 07/25/2014   PLT 157 07/25/2014   No results for input(s): NA, K, CL, CO2, BUN, CREATININE, CALCIUM, PROT, BILITOT, ALKPHOS, ALT, AST, GLUCOSE in the last 168 hours.  Invalid input(s): LABALBU No results found for: CKTOTAL, CKMB, CKMBINDEX, TROPONINI No results found for: CHOL No results found for: HDL No results found for: LDLCALC No results found for: TRIG No results found  for: CHOLHDL No results found for: LDLDIRECT     ASSESSMENT AND PLAN: 34 yo presents with a 2nd episode of SVT, 1st was about 3 weeks ago.  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  She had not had prior episode and is 5331, so probably low risk of recurrence after current pregnancy (pregnancy is likely the stressor/trigger).  Immersion in ice water and carotid sinus massage did not work this time.  Small risk to fetal development of using beta blockers/calcium channel blockers. Would continue to attempt Valsalva maneuver and diving reflex as options to interrupt arrhythmia before seeking medical  attention for episodes > 30 min. Will arrange followup.  Also would discharge her with metoprolol 25 mg to use 1 dose prn SVT symptoms.  Would still avoid regular use.  If she has another episode this pregnancy, will need regular use.   Marca AnconaDalton Tome Wilson 08/10/2014 9:34 PM

## 2014-08-10 NOTE — MAU Note (Signed)
Respiratory called for 12 lead EKG

## 2014-08-10 NOTE — Discharge Instructions (Signed)
Supraventricular Tachycardia °Supraventricular tachycardia (SVT) is when the heart beats very fast. SVT can last for a long time (sustained) or it can start and stop suddenly (nonsustained). °HOME CARE  °· Take your heart medicine as told by your doctor. Check with your doctor before taking cold, diet, or herbal medicine. °· Do not smoke. °· Do not drink large amounts of caffeine. Caffeine is found in coffee, tea, soda (pop, cola), and chocolate.  °· Keep all doctor visits as told. °GET HELP RIGHT AWAY IF:  °· You have chest pain or pressure. °· You cannot catch your breath. °· You are dizzy or lightheaded. °· You feel like you will pass out (faint). °· You are sweaty (diaphoretic) and feel sick to your stomach (nauseous) or throw up (vomit).   °If you have the above problems, call your local emergency services (911 in [place] right away. Do not drive yourself to the hospital. °MAKE SURE YOU:  °· Understand these instructions. °· Will watch your condition. °· Will get help right away if you are not doing well or get worse. °Document Released: 07/15/2005 Document Revised: 10/07/2011 Document Reviewed: 10/19/2008 °ExitCare® Patient Information ©2015 [employer]. This information is not intended to replace advice given to you by your health care provider. Make sure you discuss any questions you have with your health care provider. ° °

## 2014-08-10 NOTE — MAU Provider Note (Signed)
[name] is a 34 y.o. G2P0101 at 7585w4d who presents to MAU today with elevated heart rate. She denies abdominal pain, vaginal bleeding or contractions. She reports good fetal movement.   BP 133/81 mmHg  Pulse 163  Temp(Src) 98.2 F (36.8 C) (Oral)  Resp 18  SpO2 100% GENERAL: Well-developed, well-nourished female in no acute distress.  HEENT: Normocephalic, atraumatic.   LUNGS: Effort normal HEART: Tachycardic  SKIN: Warm, dry and without erythema PSYCH: Normal mood and affect   Fetal Monitoring: Baseline:135 bpm, moderate variability, + accelerations, no decelerations Contractions: occasion contractions  EKG shows SVT on preliminary report Medical screening exam complete Dr. Dion BodyVarnado to consult Cardiology for further management  2035 - Dr. Dion BodyVarnado called MAU. Cardiology Fellow, Dr. Shirlee LatchMclean will come to MAU to supervise the cardioversion of the patient.  Order placed to start IV prior to his arrival  Dr. Shirlee LatchMclean in MAU to supervise cardioversion. Cardioversion is successful with 6 mg Adenosine.  Cardiology recommends Rx for Metoprolol 25 mg PRN for symptoms. They will contact patient with follow-up appointment in 1-2 weeks Discussed patient update with Dr. Dion BodyVarnado. Patient to keep appointment for routine prenatal care with [facility] tomorrow as scheduled.   A: SIUP at 3285w4d SVT  P: Discharge home Rx for Metoprolol given to patient Patient advised to follow-up with Cardiology in 1-2 weeks, they will call with an appointment date/time Patient encouraged to follow-up with [facility] tomorrow as scheduled Patient may return to MAU as needed or if her condition were to change or worsen   Marny LowensteinJulie N Kamela Blansett, PA-C 08/10/2014 8:22 PM

## 2014-08-10 NOTE — MAU Note (Signed)
Adenosine IV given, cardiologist present, CRNA prsent, OB rapid response nurse present.

## 2014-08-22 ENCOUNTER — Other Ambulatory Visit: Payer: Self-pay | Admitting: Obstetrics and Gynecology

## 2014-08-22 ENCOUNTER — Observation Stay (HOSPITAL_COMMUNITY)
Admission: AD | Admit: 2014-08-22 | Discharge: 2014-08-23 | Disposition: A | Discharge status: Home / Self Care | Payer: 59 | Source: Ambulatory Visit | Attending: Obstetrics & Gynecology | Admitting: Obstetrics & Gynecology

## 2014-08-22 ENCOUNTER — Encounter (HOSPITAL_COMMUNITY): Payer: Self-pay | Admitting: *Deleted

## 2014-08-22 ENCOUNTER — Ambulatory Visit (HOSPITAL_COMMUNITY)
Admission: RE | Admit: 2014-08-22 | Discharge: 2014-08-22 | Disposition: A | Discharge status: Home / Self Care | Payer: 59 | Source: Ambulatory Visit | Attending: Obstetrics and Gynecology | Admitting: Obstetrics and Gynecology

## 2014-08-22 DIAGNOSIS — O26873 Cervical shortening, third trimester: Secondary | ICD-10-CM | POA: Insufficient documentation

## 2014-08-22 DIAGNOSIS — O24419 Gestational diabetes mellitus in pregnancy, unspecified control: Secondary | ICD-10-CM

## 2014-08-22 DIAGNOSIS — Z3483 Encounter for supervision of other normal pregnancy, third trimester: Principal | ICD-10-CM | POA: Insufficient documentation

## 2014-08-22 DIAGNOSIS — Z3493 Encounter for supervision of normal pregnancy, unspecified, third trimester: Secondary | ICD-10-CM

## 2014-08-22 DIAGNOSIS — IMO0002 Reserved for concepts with insufficient information to code with codable children: Secondary | ICD-10-CM

## 2014-08-22 DIAGNOSIS — O24410 Gestational diabetes mellitus in pregnancy, diet controlled: Secondary | ICD-10-CM | POA: Insufficient documentation

## 2014-08-22 DIAGNOSIS — O09213 Supervision of pregnancy with history of pre-term labor, third trimester: Secondary | ICD-10-CM | POA: Insufficient documentation

## 2014-08-22 DIAGNOSIS — O24414 Gestational diabetes mellitus in pregnancy, insulin controlled: Secondary | ICD-10-CM

## 2014-08-22 DIAGNOSIS — O288 Other abnormal findings on antenatal screening of mother: Secondary | ICD-10-CM | POA: Insufficient documentation

## 2014-08-22 DIAGNOSIS — Z3A35 35 weeks gestation of pregnancy: Secondary | ICD-10-CM

## 2014-08-22 DIAGNOSIS — Z91040 Latex allergy status: Secondary | ICD-10-CM | POA: Diagnosis not present

## 2014-08-22 DIAGNOSIS — I471 Supraventricular tachycardia: Secondary | ICD-10-CM | POA: Insufficient documentation

## 2014-08-22 HISTORY — DX: Cardiac arrhythmia, unspecified: I49.9

## 2014-08-22 HISTORY — DX: Irritable bowel syndrome, unspecified: K58.9

## 2014-08-22 LAB — GLUCOSE, CAPILLARY: GLUCOSE-CAPILLARY: 83 mg/dL (ref 70–99)

## 2014-08-22 LAB — OB RESULTS CONSOLE GBS: STREP GROUP B AG: POSITIVE

## 2014-08-22 LAB — GROUP B STREP BY PCR: GROUP B STREP BY PCR: POSITIVE — AB

## 2014-08-22 MED ORDER — ACETAMINOPHEN 325 MG PO TABS: 650.0000 mg | ORAL_TABLET | ORAL | Status: DC | PRN

## 2014-08-22 MED ORDER — DOCUSATE SODIUM 100 MG PO CAPS: 100.0000 mg | ORAL_CAPSULE | Freq: Every day | ORAL | Status: DC

## 2014-08-22 MED ORDER — ZOLPIDEM TARTRATE 5 MG PO TABS: 5.0000 mg | ORAL_TABLET | Freq: Every evening | ORAL | Status: DC | PRN

## 2014-08-22 MED ORDER — PRENATAL MULTIVITAMIN CH: 1.0000 | ORAL_TABLET | Freq: Every day | ORAL | Status: DC

## 2014-08-22 MED ORDER — CALCIUM CARBONATE ANTACID 500 MG PO CHEW: 2.0000 | CHEWABLE_TABLET | ORAL | Status: DC | PRN

## 2014-08-22 MED ORDER — METOPROLOL TARTRATE 25 MG PO TABS
25.0000 mg | ORAL_TABLET | ORAL | Status: DC | PRN
  Filled 2014-08-22: qty 1

## 2014-08-22 MED ORDER — PRENATAL MULTIVITAMIN CH
1.0000 | ORAL_TABLET | Freq: Every day | ORAL | Status: DC
  Administered 2014-08-23: 1 via ORAL
  Filled 2014-08-22: qty 1

## 2014-08-22 MED ORDER — GLYBURIDE 2.5 MG PO TABS
2.5000 mg | ORAL_TABLET | Freq: Every day | ORAL | Status: DC
Start: 2014-08-23 — End: 2014-08-23
  Administered 2014-08-23: 2.5 mg via ORAL
  Filled 2014-08-22: qty 1

## 2014-08-22 MED ORDER — GLYBURIDE 2.5 MG PO TABS
1.2500 mg | ORAL_TABLET | Freq: Every day | ORAL | Status: DC
  Administered 2014-08-22: 1.25 mg via ORAL
  Filled 2014-08-22: qty 0.5

## 2014-08-22 NOTE — H&P (Signed)
HPI: 34 y/o G3P0111 @ 874w2d estimated gestational age who presents for prolonged monitoring.  Pt had non-reactive NST in the office today and was sent to the hospital for a BPP.  The BPP showed 6/8 with no fetal breathing.  She reports no Leaking of Fluid,   no Vaginal Bleeding,   Irregular Uterine Contractions- no change from her baseline,  + Fetal Movement.  ROS: no HA, no epigastric pain, no visual changes.    Pregnancy complicated by: 1) h/o PPROM @ 27wk   s/p 17-P weekly 2) short cervix- US @ 31wk showed dynamic cervix 1.4cm-2.3cm with funneling  pt has been on bedrest 3) GDMA2- pt non compliant, recently glyburide increased to 2.5mg  in am and 1.25mg  in pm 4) UTI- s/p tx with Keflex, TOC negative   Prenatal Transfer Tool  Maternal Diabetes: Yes:  Diabetes Type:  Insulin/Medication controlled Genetic Screening: Normal Maternal Ultrasounds/Referrals: Normal Fetal Ultrasounds or other Referrals:  None Maternal Substance Abuse:  No Significant Maternal Medications:  Meds include: Progesterone, glyburide Significant Maternal Lab Results: None   PNL:  GBS unknown, Rub Immune, Hep B neg, RPR NR, HIV neg, GC/C neg, glucola:abnormal Hgb 11.1 Blood type: O positive, antibody negative  OBHx: SAB x 1, @ 27wk- PPROM, SVD, 3#0, Female PMHx:  See above Meds:  PNV, glyburide Allergy:   Allergies  Allergen Reactions  . Latex Itching   SurgHx: none SocHx:   no Tobacco, no  EtOH, no Illicit Drugs  O: BP 128/73 mmHg  Pulse 94  Temp(Src) 97.6 F (36.4 C) (Oral)  Resp 20  SpO2 99% Gen. AAOx3, NAD CV.  RRR   Abd. Gravid,  no tenderness,  no rigidity,  no guarding Extr.  1+ non-pitting edema B/L , no calf tenderness  FHT: 140 baseline, moderate variability, no accels,  no decels Toco: irregular SVE: 1/50/-3, ballotable   A/P:  34 y.o. [id number] @ 6774w2d EGA who presents for prolonged fetal monitoring -FWB:  NICHD Cat I FHTs, plan for repeat BPP in am -GDMA2- continue home glyburide  2.5mg  in am, 1.25mg  in pm.  Diabetic diet ordered -h/o PPROM and short cervix, continue with modified bed rest -SVT in pregnancy- metoprolol 25mg  prn, currently asymptomatic -Prenatal care- continue PNV daily, tylenol prn -GBS collected  DISPO: Plan for observation overnight with repeat BPP in am.  Further management pending the results of BPP.  Dr. Dion BodyVarnado to resume her care in the am  Myna HidalgoJennifer Mannat Benedetti, DO [phone] (pager) [phone] (office)

## 2014-08-23 ENCOUNTER — Observation Stay (HOSPITAL_COMMUNITY): Payer: 59

## 2014-08-23 DIAGNOSIS — O24410 Gestational diabetes mellitus in pregnancy, diet controlled: Secondary | ICD-10-CM | POA: Insufficient documentation

## 2014-08-23 DIAGNOSIS — Z3483 Encounter for supervision of other normal pregnancy, third trimester: Secondary | ICD-10-CM | POA: Diagnosis not present

## 2014-08-23 DIAGNOSIS — I471 Supraventricular tachycardia: Secondary | ICD-10-CM | POA: Insufficient documentation

## 2014-08-23 LAB — URINALYSIS, ROUTINE W REFLEX MICROSCOPIC
BILIRUBIN URINE: NEGATIVE
Glucose, UA: NEGATIVE mg/dL
Ketones, ur: 40 mg/dL — AB
Leukocytes, UA: NEGATIVE
NITRITE: NEGATIVE
PH: 6 (ref 5.0–8.0)
PROTEIN: NEGATIVE mg/dL
SPECIFIC GRAVITY, URINE: 1.025 (ref 1.005–1.030)
Urobilinogen, UA: 0.2 mg/dL (ref 0.0–1.0)

## 2014-08-23 LAB — GLUCOSE, CAPILLARY: Glucose-Capillary: 130 mg/dL — ABNORMAL HIGH (ref 70–99)

## 2014-08-23 LAB — URINE MICROSCOPIC-ADD ON

## 2014-08-23 NOTE — Progress Notes (Signed)
Patient's fasting glucose 76 this morning per night shift RN, glucometer not working.

## 2014-08-23 NOTE — Plan of Care (Signed)
Problem: Consults Goal: Birthing Suites Patient Information Press F2 to bring up selections list  Outcome: Completed/Met Date Met:  08/23/14  Diabetic, Fetal indication and Antenatal Patient (< 37 weeks)

## 2014-08-23 NOTE — Progress Notes (Signed)
Ultrasound called to verify patient appt. RN to bring pt now. EFM and toco removed.

## 2014-08-23 NOTE — Discharge Summary (Signed)
Physician Discharge Summary  Patient ID: Amber Davenport MRN: [id number] DOB/AGE: March 27, 1981 34 y.o.  Admit date: 08/22/2014 Discharge date: 08/23/2014  Admission Diagnoses: Non-reactive NST  Discharge Diagnoses:  Active Problems:   Non-reactive NST (non-stress test)   Diet controlled gestational diabetes mellitus in third trimester   SVT (supraventricular tachycardia)   Discharged Condition: stable  Hospital Course: 34 y/o G3P0111 @ [redacted]w[redacted]d estimated gestational age who presents for prolonged monitoring. Pt had non-reactive NST in the office today and was sent to the hospital for a BPP. The BPP showed 6/8 with no fetal breathing. Overnight, fetal monitoring was reassuring and fetal heart tones were Cat. I with reactive tracing.  Repeat BPP 8/8.  Pt was dishcarged home in stable condition with close oupatient follow up.  Pregnancy complicated by: 1) h/o PPROM @ 27wk  s/p 17-P weekly 2) short cervix- [place] @ 31wk showed dynamic cervix 1.4cm-2.3cm with funneling pt has been on bedrest 3) GDMA2- pt non compliant, recently glyburide increased to 2.5mg  in am and 1.25mg  in pm 4) UTI- s/p tx with Keflex, TOC negative  Consults: None  Significant Diagnostic Studies: BPP: 6/8 then 8/8 following day  Treatments: fetal monitoring  Discharge Exam: Blood pressure 122/74, pulse 98, temperature 97.5 F (36.4 C), temperature source Oral, resp. rate 18, height 5' 4.5" (1.638 m), weight 105.235 kg (232 lb), SpO2 99 %, unknown if currently breastfeeding. Gen. AAOx3, NAD CV. RRR  Abd. Gravid, no tenderness, no rigidity, no guarding Extr. 1+ non-pitting edema B/L , no calf tenderness  FHT: 140 baseline, moderate variability, + accels, no decels Toco: irregular SVE: 1/50/-3, ballotable  Disposition: 01-Home or Self Care  Discharge Instructions    Discharge activity: Bedrest    Complete by:  As directed      Discharge diet:    Complete by:  As directed    Diabetic diet.     Discharge instructions    Complete by:  As directed   See discharge instructions.     Do not have sex or do anything that might make you have an orgasm    Complete by:  As directed      Fetal Kick Count:  Lie on our left side for one hour after a meal, and count the number of times your baby kicks.  If it is less than 5 times, get up, move around and drink some juice.  Repeat the test 30 minutes later.  If it is still less than 5 kicks in an hour, notify your doctor.    Complete by:  As directed      Notify physician for a general feeling that "something is not right"    Complete by:  As directed      Notify physician for increase or change in vaginal discharge    Complete by:  As directed      Notify physician for intestinal cramps, with or without diarrhea, sometimes described as "gas pain"    Complete by:  As directed      Notify physician for leaking of fluid    Complete by:  As directed      Notify physician for low, dull backache, unrelieved by heat or Tylenol    Complete by:  As directed      Notify physician for menstrual like cramps    Complete by:  As directed      Notify physician for pelvic pressure    Complete by:  As directed      Notify physician for  uterine contractions.  These may be painless and feel like the uterus is tightening or the baby is  "balling up"    Complete by:  As directed      Notify physician for vaginal bleeding    Complete by:  As directed      PRETERM LABOR:  Includes any of the follwing symptoms that occur between 20 - [redacted] weeks gestation.  If these symptoms are not stopped, preterm labor can result in preterm delivery, placing your baby at risk    Complete by:  As directed             Medication List    TAKE these medications        glyBURIDE 2.5 MG tablet  Commonly known as:  DIABETA  Take 2.5 mg by mouth daily with breakfast.     glyBURIDE 1.25 MG tablet  Commonly known as:  DIABETA  Take 1.25 mg by mouth at  bedtime.     metoprolol tartrate 25 MG tablet  Commonly known as:  LOPRESSOR  Take 1 tablet (25 mg total) by mouth as needed.     prenatal multivitamin Tabs tablet  Take 1 tablet by mouth daily at 12 noon.         SignedGeryl Rankins: VARNADO, EVELYN 08/23/2014, 11:56 AM

## 2014-08-23 NOTE — Discharge Instructions (Signed)

## 2014-08-23 NOTE — Progress Notes (Signed)
D/C instructions discussed with patient and family. Educated patient on importance of continuing medications, fetal kick counts/movement, and keeping f/u appointments. Patient and family verbalize understanding with no further questions or concerns at this time. EFM and toco removed, patient d/c with family after lunch.

## 2014-08-24 LAB — GLUCOSE, CAPILLARY: GLUCOSE-CAPILLARY: 79 mg/dL (ref 70–99)

## 2014-08-26 ENCOUNTER — Inpatient Hospital Stay (HOSPITAL_COMMUNITY)
Admission: AD | Admit: 2014-08-26 | Discharge: 2014-08-26 | Disposition: A | Discharge status: Home / Self Care | Payer: 59 | Source: Ambulatory Visit | Attending: Obstetrics & Gynecology | Admitting: Obstetrics & Gynecology

## 2014-08-26 ENCOUNTER — Inpatient Hospital Stay (HOSPITAL_COMMUNITY): Payer: 59

## 2014-08-26 ENCOUNTER — Encounter (HOSPITAL_COMMUNITY): Payer: Self-pay | Admitting: *Deleted

## 2014-08-26 DIAGNOSIS — O289 Unspecified abnormal findings on antenatal screening of mother: Secondary | ICD-10-CM

## 2014-08-26 DIAGNOSIS — O288 Other abnormal findings on antenatal screening of mother: Secondary | ICD-10-CM

## 2014-08-26 DIAGNOSIS — Z3A34 34 weeks gestation of pregnancy: Secondary | ICD-10-CM | POA: Insufficient documentation

## 2014-08-26 HISTORY — DX: Supraventricular tachycardia: I47.1

## 2014-08-26 LAB — URINALYSIS, ROUTINE W REFLEX MICROSCOPIC
Bilirubin Urine: NEGATIVE
Glucose, UA: NEGATIVE mg/dL
Ketones, ur: >80 mg/dL — AB
Leukocytes, UA: NEGATIVE
Nitrite: POSITIVE — AB
PH: 6 (ref 5.0–8.0)
Protein, ur: NEGATIVE mg/dL
Specific Gravity, Urine: 1.025 (ref 1.005–1.030)
UROBILINOGEN UA: 0.2 mg/dL (ref 0.0–1.0)

## 2014-08-26 LAB — URINE MICROSCOPIC-ADD ON

## 2014-08-26 NOTE — Discharge Instructions (Signed)
Fetal Biophysical Profile °This is a test that measures five different variables of the fetus: Heart rate, breathing movement, total movement of the baby, fetal muscle tone, the amount of amniotic fluid, and the heart rate activity of the fetus. The five variables are measured individually and contribute either a 2 or a 0 to the overall scoring of the test. The measurements are as follows: °· Fetal heart rate activity. This is measured and scored in the same way as a non-stress test. The fetal heart rate is considered reactive when there are movement-associated fetal heart rate increases of at least 15 beats per minute above baseline, and 15 seconds in duration over a 20-minute period. A score of 2 is given for reactivity, and a score of 0 indicates that the fetal heart rate is non-reactive. °· Fetal breathing movements. This is scored based on fetal breathing movements and indicate fetal well-being. Their absence may indicate a low oxygen level for the fetus. Fetal breathing increases in frequency and uniformity after the 36th week of pregnancy. To earn a score of 2, the fetus must have at least one episode of fetal breathing lasting at least 60 seconds within a 30-minute observation. Absence of this breathing is scored a 0 on the BPP. °· Fetal body movements. Fetal activity is a reflection of brain integrity and function. The presence of at least three episodes of fetal movements within a 30-minute period is given a score of 2. A score of 0 is given with two or less movements in this time period. Fetal activity is highest 1 to 3 hours after the mother has eaten a meal. °· Fetal tone. In the uterus, the fetus is normally in a position of flexion. This means the head is bent down towards the knees. The fetus also stretches, rolls, and moves in the uterus. The arms, legs, trunk, and head may be flexed and extended. A score of 2 is earned when there is at least one episode of active extension with return flexion. A  score of 0 is given for slow extension with a return to only partial flexion. Fetal movement not followed by return to flexion, limbs or spine in extension, and an open fetal hand score 0. °· Amniotic fluid volume. Amniotic fluid volume has been demonstrated to be a good method of predicting fetal distress. Too little amniotic fluid has been associated with fetal abnormalities, slow uterine growth, and over due pregnancy. A score of 2 is given for this when there is at least one pocket of amniotic fluid that measures 1 cm in a specific area. A score of 0 indicates either that fluid is absent in most areas of the uterine cavity or that the largest pocket of fluid measures less than 1 cm. °PREPARATION FOR TEST °No preparation or fasting is necessary. °NORMAL FINDINGS °· A score of 8-10 points (if amniotic fluid volume is adequate). °· Possible critical values: Less than 4 may necessitate immediate delivery of fetus. °Ranges for normal findings may vary among different laboratories and hospitals. You should always check with your doctor after having lab work or other tests done to discuss the meaning of your test results and whether your values are considered within normal limits. °MEANING OF TEST  °Your caregiver will go over the test results with you and discuss the importance and meaning of your results, as well as treatment options and the need for additional tests if necessary. °OBTAINING THE TEST RESULTS  °It is your responsibility to obtain your test   results. Ask the lab or department performing the test when and how you will get your results. °Document Released: 11/15/2004 Document Revised: 10/07/2011 Document Reviewed: 06/24/2008 °ExitCare® Patient Information ©2015 [employer]. This information is not intended to replace advice given to you by your health care provider. Make sure you discuss any questions you have with your health care provider. ° °

## 2014-08-26 NOTE — MAU Note (Signed)
Pt sent by Dr. Charlotta Newtonzan after ultrasound today.  Pt was told she needed a BPP.

## 2014-08-26 NOTE — MAU Provider Note (Signed)
History     CSN: [id number]  Arrival date and time: 08/26/14 1225   First Provider Initiated Contact with Patient 08/26/14 1323      Chief Complaint  Patient presents with  . Non-stress Test   HPI Comments: Amber Davenport 34 y.o. G2P0101 [id number] presents to MAU for BPP. She had a Nonreactive NST in office and was sent here for further evaluation. She denies any vaginal bleeding, vaginal LOF or discharge. Admits to +FM.      Past Medical History  Diagnosis Date  . Anemia   . Diabetes mellitus without complication     diet and oral hypoglycemic control  . Gestational diabetes   . Dysrhythmia 2015    rapid heartbeat, cardioverted twice in last three weeks using Adenosine.  . Irritable bowel syndrome     since high school  . Hypertension   . SVT (supraventricular tachycardia) 07-2014    during current pregnancy    Past Surgical History  Procedure Laterality Date  . Mouth surgery  2004    Family History  Problem Relation Age of Onset  . Asthma Brother   . Hypertension Maternal Grandmother   . Drug abuse Maternal Grandfather     History  Substance Use Topics  . Smoking status: Never Smoker   . Smokeless tobacco: Never Used  . Alcohol Use: No    Allergies:  Allergies  Allergen Reactions  . Latex Itching    Prescriptions prior to admission  Medication Sig Dispense Refill Last Dose  . glyBURIDE (DIABETA) 1.25 MG tablet Take 1.25 mg by mouth at bedtime.   08/22/2014 at Unknown time  . glyBURIDE (DIABETA) 2.5 MG tablet Take 2.5 mg by mouth daily with breakfast.   08/22/2014 at Unknown time  . metoprolol tartrate (LOPRESSOR) 25 MG tablet Take 1 tablet (25 mg total) by mouth as needed. (Patient taking differently: Take 25 mg by mouth as needed (For increased heart rate.). ) 5 tablet 0 Has not used.  . Prenatal Vit-Fe Fumarate-FA (PRENATAL MULTIVITAMIN) TABS tablet Take 1 tablet by mouth daily at 12 noon.   Past Week at Unknown time    Review of Systems   Constitutional: Negative.   HENT: Negative.   Eyes: Negative.   Respiratory: Negative.   Cardiovascular: Negative.   Gastrointestinal: Negative.   Genitourinary: Negative.   Musculoskeletal: Negative.   Skin: Negative.   Neurological: Negative.   Psychiatric/Behavioral: Negative.    Physical Exam   Blood pressure 133/74, pulse 104, resp. rate 18, SpO2 98 %, unknown if currently breastfeeding.  Physical Exam  Constitutional: She is oriented to person, place, and time. She appears well-developed and well-nourished. No distress.  HENT:  Head: Normocephalic and atraumatic.  Eyes: Pupils are equal, round, and reactive to light.  Cardiovascular: Normal rate, regular rhythm and normal heart sounds.   Respiratory: Effort normal. No respiratory distress. She has no wheezes. She has no rales.  Neurological: She is alert and oriented to person, place, and time.  Skin: Skin is warm and dry.  Psychiatric: She has a normal mood and affect. Her behavior is normal. Judgment and thought content normal.   No results found for this or any previous visit (from the past 24 hour(s)).  BPP was 8/8 and results called to Dr Neil Crouchz  MAU Course  Procedures  MDM  BPP per Dr Charlotta Newtonzan  Assessment and Plan   A: NST nonreactive  P: BPP complete Follow up with [facility] on Monday  Carolynn ServeBarefoot, Mycala Warshawsky Miller 08/26/2014, 1:35 PM

## 2014-08-26 NOTE — MAU Note (Addendum)
Dr. Charlotta Newtonzan called due to pt refusing to be evaluated in MAU and be monitored.  Pt states [facility] and was told she just needed a BPP.  Dr. Charlotta Newtonzan states pt can either go through MAU and be monitored and then BPP or can sign out AMA and follow up with next appointment.  Pt decided she would stay and is undressing to be placed on EFM.  Placed pt on EFM, pt would not get off phone with husband.  RN will return when pt is finished with call to assess.

## 2014-08-26 NOTE — MAU Note (Signed)
Pt states she has a nonreactive NST and sent for further evaluation.  Denies vaginal bleeding, abnormal discharge, or ROM.  Good fetal movement.

## 2014-09-02 ENCOUNTER — Inpatient Hospital Stay (HOSPITAL_COMMUNITY)
Admission: AD | Admit: 2014-09-02 | Discharge: 2014-09-02 | Disposition: A | Discharge status: Home / Self Care | Payer: 59 | Source: Ambulatory Visit | Attending: Obstetrics and Gynecology | Admitting: Obstetrics and Gynecology

## 2014-09-02 DIAGNOSIS — Z3A35 35 weeks gestation of pregnancy: Secondary | ICD-10-CM | POA: Diagnosis not present

## 2014-09-02 DIAGNOSIS — O283 Abnormal ultrasonic finding on antenatal screening of mother: Secondary | ICD-10-CM | POA: Insufficient documentation

## 2014-09-02 NOTE — MAU Note (Addendum)
Missed appt at office for routine NST (Dr Charlotta Newtonzan had called prior to her arrival). No complaints

## 2014-09-09 ENCOUNTER — Inpatient Hospital Stay (EMERGENCY_DEPARTMENT_HOSPITAL)
Admission: AD | Admit: 2014-09-09 | Discharge: 2014-09-09 | Disposition: A | Discharge status: Home / Self Care | Payer: 59 | Source: Ambulatory Visit | Attending: Obstetrics and Gynecology | Admitting: Obstetrics and Gynecology

## 2014-09-09 ENCOUNTER — Encounter (HOSPITAL_COMMUNITY): Payer: Self-pay | Admitting: *Deleted

## 2014-09-09 ENCOUNTER — Other Ambulatory Visit: Payer: Self-pay

## 2014-09-09 DIAGNOSIS — O9989 Other specified diseases and conditions complicating pregnancy, childbirth and the puerperium: Secondary | ICD-10-CM | POA: Insufficient documentation

## 2014-09-09 DIAGNOSIS — O26873 Cervical shortening, third trimester: Principal | ICD-10-CM | POA: Diagnosis present

## 2014-09-09 DIAGNOSIS — Z3A37 37 weeks gestation of pregnancy: Secondary | ICD-10-CM | POA: Diagnosis not present

## 2014-09-09 DIAGNOSIS — O99824 Streptococcus B carrier state complicating childbirth: Secondary | ICD-10-CM | POA: Diagnosis present

## 2014-09-09 DIAGNOSIS — O99413 Diseases of the circulatory system complicating pregnancy, third trimester: Secondary | ICD-10-CM

## 2014-09-09 DIAGNOSIS — Z79899 Other long term (current) drug therapy: Secondary | ICD-10-CM

## 2014-09-09 DIAGNOSIS — O4292 Full-term premature rupture of membranes, unspecified as to length of time between rupture and onset of labor: Secondary | ICD-10-CM | POA: Diagnosis present

## 2014-09-09 DIAGNOSIS — I471 Supraventricular tachycardia: Secondary | ICD-10-CM

## 2014-09-09 DIAGNOSIS — O471 False labor at or after 37 completed weeks of gestation: Secondary | ICD-10-CM | POA: Diagnosis not present

## 2014-09-09 DIAGNOSIS — Z3A38 38 weeks gestation of pregnancy: Secondary | ICD-10-CM | POA: Diagnosis present

## 2014-09-09 DIAGNOSIS — O24419 Gestational diabetes mellitus in pregnancy, unspecified control: Secondary | ICD-10-CM | POA: Diagnosis present

## 2014-09-09 MED ORDER — ADENOSINE 6 MG/2ML IV SOLN
6.0000 mg | Freq: Once | INTRAVENOUS | Status: AC
  Administered 2014-09-09: 6 mg via INTRAVENOUS
  Filled 2014-09-09: qty 2

## 2014-09-09 MED ORDER — SODIUM CHLORIDE 0.9 % IV SOLN
INTRAVENOUS | Status: DC
  Administered 2014-09-09: 06:00:00 via INTRAVENOUS

## 2014-09-09 MED ORDER — LACTATED RINGERS IV SOLN: INTRAVENOUS | Status: DC

## 2014-09-09 MED ORDER — METOPROLOL TARTRATE 25 MG PO TABS: 25.0000 mg | ORAL_TABLET | Freq: Every day | ORAL | Status: DC | PRN

## 2014-09-09 MED ORDER — METOPROLOL TARTRATE 1 MG/ML IV SOLN
5.0000 mg | INTRAVENOUS | Status: DC | PRN
  Administered 2014-09-09: 5 mg via INTRAVENOUS
  Filled 2014-09-09 (×3): qty 5

## 2014-09-09 NOTE — MAU Note (Signed)
APPLIED  EKG   MONITOR

## 2014-09-09 NOTE — Discharge Instructions (Signed)
Third Trimester of Pregnancy The third trimester is from week 29 through week 42, months 7 through 9. The third trimester is a time when the fetus is growing rapidly. At the end of the ninth month, the fetus is about 20 inches in length and weighs 6-10 pounds.  BODY CHANGES Your body goes through many changes during pregnancy. The changes vary from woman to woman.   Your weight will continue to increase. You can expect to gain 25-35 pounds (11-16 kg) by the end of the pregnancy.  You may begin to get stretch marks on your hips, abdomen, and breasts.  You may urinate more often because the fetus is moving lower into your pelvis and pressing on your bladder.  You may develop or continue to have heartburn as a result of your pregnancy.  You may develop constipation because certain hormones are causing the muscles that push waste through your intestines to slow down.  You may develop hemorrhoids or swollen, bulging veins (varicose veins).  You may have pelvic pain because of the weight gain and pregnancy hormones relaxing your joints between the bones in your pelvis. Backaches may result from overexertion of the muscles supporting your posture.  You may have changes in your hair. These can include thickening of your hair, rapid growth, and changes in texture. Some women also have hair loss during or after pregnancy, or hair that feels dry or thin. Your hair will most likely return to normal after your baby is born.  Your breasts will continue to grow and be tender. A yellow discharge may leak from your breasts called colostrum.  Your belly button may stick out.  You may feel short of breath because of your expanding uterus.  You may notice the fetus "dropping," or moving lower in your abdomen.  You may have a bloody mucus discharge. This usually occurs a few days to a week before labor begins.  Your cervix becomes thin and soft (effaced) near your due date. WHAT TO EXPECT AT YOUR PRENATAL  EXAMS  You will have prenatal exams every 2 weeks until week 36. Then, you will have weekly prenatal exams. During a routine prenatal visit:  You will be weighed to make sure you and the fetus are growing normally.  Your blood pressure is taken.  Your abdomen will be measured to track your baby's growth.  The fetal heartbeat will be listened to.  Any test results from the previous visit will be discussed.  You may have a cervical check near your due date to see if you have effaced. At around 36 weeks, your caregiver will check your cervix. At the same time, your caregiver will also perform a test on the secretions of the vaginal tissue. This test is to determine if a type of bacteria, Group B streptococcus, is present. Your caregiver will explain this further. Your caregiver may ask you:  What your birth plan is.  How you are feeling.  If you are feeling the baby move.  If you have had any abnormal symptoms, such as leaking fluid, bleeding, severe headaches, or abdominal cramping.  If you have any questions. Other tests or screenings that may be performed during your third trimester include:  Blood tests that check for low iron levels (anemia).  Fetal testing to check the health, activity level, and growth of the fetus. Testing is done if you have certain medical conditions or if there are problems during the pregnancy. FALSE LABOR You may feel small, irregular contractions that   eventually go away. These are called Braxton Hicks contractions, or false labor. Contractions may last for hours, days, or even weeks before true labor sets in. If contractions come at regular intervals, intensify, or become painful, it is best to be seen by your caregiver.  SIGNS OF LABOR   Menstrual-like cramps.  Contractions that are 5 minutes apart or less.  Contractions that start on the top of the uterus and spread down to the lower abdomen and back.  A sense of increased pelvic pressure or back  pain.  A watery or bloody mucus discharge that comes from the vagina. If you have any of these signs before the 37th week of pregnancy, call your caregiver right away. You need to go to the hospital to get checked immediately. HOME CARE INSTRUCTIONS   Avoid all smoking, herbs, alcohol, and unprescribed drugs. These chemicals affect the formation and growth of the baby.  Follow your caregiver's instructions regarding medicine use. There are medicines that are either safe or unsafe to take during pregnancy.  Exercise only as directed by your caregiver. Experiencing uterine cramps is a good sign to stop exercising.  Continue to eat regular, healthy meals.  Wear a good support bra for breast tenderness.  Do not use hot tubs, steam rooms, or saunas.  Wear your seat belt at all times when driving.  Avoid raw meat, uncooked cheese, cat litter boxes, and soil used by cats. These carry germs that can cause birth defects in the baby.  Take your prenatal vitamins.  Try taking a stool softener (if your caregiver approves) if you develop constipation. Eat more high-fiber foods, such as fresh vegetables or fruit and whole grains. Drink plenty of fluids to keep your urine clear or pale yellow.  Take warm sitz baths to soothe any pain or discomfort caused by hemorrhoids. Use hemorrhoid cream if your caregiver approves.  If you develop varicose veins, wear support hose. Elevate your feet for 15 minutes, 3-4 times a day. Limit salt in your diet.  Avoid heavy lifting, wear low heal shoes, and practice good posture.  Rest a lot with your legs elevated if you have leg cramps or low back pain.  Visit your dentist if you have not gone during your pregnancy. Use a soft toothbrush to brush your teeth and be gentle when you floss.  A sexual relationship may be continued unless your caregiver directs you otherwise.  Do not travel far distances unless it is absolutely necessary and only with the approval  of your caregiver.  Take prenatal classes to understand, practice, and ask questions about the labor and delivery.  Make a trial run to the hospital.  Pack your hospital bag.  Prepare the baby's nursery.  Continue to go to all your prenatal visits as directed by your caregiver. SEEK MEDICAL CARE IF:  You are unsure if you are in labor or if your water has broken.  You have dizziness.  You have mild pelvic cramps, pelvic pressure, or nagging pain in your abdominal area.  You have persistent nausea, vomiting, or diarrhea.  You have a bad smelling vaginal discharge.  You have pain with urination. SEEK IMMEDIATE MEDICAL CARE IF:   You have a fever.  You are leaking fluid from your vagina.  You have spotting or bleeding from your vagina.  You have severe abdominal cramping or pain.  You have rapid weight loss or gain.  You have shortness of breath with chest pain.  You notice sudden or extreme swelling   of your face, hands, ankles, feet, or legs.  You have not felt your baby move in over an hour.  You have severe headaches that do not go away with medicine.  You have vision changes. Document Released: 07/09/2001 Document Revised: 07/20/2013 Document Reviewed: 09/15/2012 ExitCare Patient Information 2015 [employer]. This information is not intended to replace advice given to you by your health care provider. Make sure you discuss any questions you have with your health care provider.  

## 2014-09-09 NOTE — MAU Note (Signed)
Pt reports her heart started racing about 3:30 am, states she has had this happen in the past. Was here one week ago and was cardioverted.

## 2014-09-09 NOTE — MAU Note (Signed)
Dr. Bensimon at bedside.  

## 2014-09-09 NOTE — Progress Notes (Signed)
Contacted by nursing staff regarding onset of SVT. Patient on metoprolol. Carotid massage and valsalva maneuver not helping. Will check with Ob and potentially try 5mg  IV lopressor q645min PRN for 3 doses if SBP >110. Will check with cardiology DOD Dr Gala RomneyBensimhon for further eval.  Signed, Azalee CourseHao See Beharry PA Pager: [phone]

## 2014-09-09 NOTE — Consult Note (Signed)
Reason for Consultation: SVT   HPI:  Amber Davenport a 34 y.o. female with a past medical history significant for gestational diabetes, PSVT and anemia, now [redacted] weeks pregnant with her second child. Had very abrupt onset of rapid palpitations this mornina around 3a, but no angina, dyspnea, or presyncope tonight. Came to [facility].   During this pregnancy has had 2 episodes of PSVT with no response to vagal maneuvers. Successfully treated with adenosine.   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 Lopressor at home and in ER did not work. Carotid sinus compression by me unsuccessful. Adenosine 6 mg IV returned her to sinus tach with no complication.   Repeat ECG shows sinus tachy.   Review of systems complete and found to be negative unless listed above in HPI  Past Medical History: 1. Gestational diabetes 2. Anemia 3. SVT: 1st episode in 12/15, 1/16, 3rd episode tonight.   Review of Systems:     Cardiac Review of Systems: {Y] = yes  = no  Chest Pain [    ]  Resting SOB [   ] Exertional SOB  [  ]  Orthopnea [  ]   Pedal Edema [ y  ]    Palpitations [ y ] Syncope  [  ]   Presyncope [   ]  General Review of Systems: [Y] = yes [  ]=no Constitional: recent weight change [  ]; anorexia [  ]; fatigue [  ]; nausea [  ]; night sweats [  ]; fever [  ]; or chills [  ];                                                                     Eyes : blurred vision [  ]; diplopia [   ]; vision changes [  ];  Amaurosis fugax[  ]; Resp: cough [  ];  wheezing[  ];  hemoptysis[  ];  PND [  ];  GI:  gallstones[  ], vomiting[  ];  dysphagia[  ]; melena[  ];  hematochezia [  ]; heartburn[  ];   GU: kidney stones [  ]; hematuria[  ];   dysuria [  ];  nocturia[  ]; incontinence [  ];             Skin: rash, swelling[  ];, hair loss[  ];  peripheral edema[  ];  or itching[  ]; Musculosketetal: myalgias[  ];  joint swelling[  ];  joint erythema[  ];  joint pain[y  ];  back pain[   ];  Heme/Lymph: bruising[  ];  bleeding[  ];  anemia[  ];  Neuro: TIA[  ];  headaches[  ];  stroke[  ];  vertigo[  ];  seizures[  ];   paresthesias[  ];  difficulty walking[  ];  Psych:depression[  ]; anxiety[  ];  Endocrine: diabetes[ y ];  thyroid dysfunction[  ];  Other:  Past Medical History  Diagnosis Date  . Anemia   . Diabetes mellitus without complication     diet and oral hypoglycemic control  . Gestational diabetes   . Dysrhythmia 2015    rapid heartbeat, cardioverted twice in last three weeks using Adenosine.  Marland Kitchen  Irritable bowel syndrome     since high school  . Hypertension   . SVT (supraventricular tachycardia) 07-2014    during current pregnancy    Medications Prior to Admission  Medication Sig Dispense Refill  . glyBURIDE (DIABETA) 1.25 MG tablet Take 1.25 mg by mouth at bedtime.    Marland Kitchen. glyBURIDE (DIABETA) 2.5 MG tablet Take 2.5 mg by mouth daily with breakfast.    . metoprolol tartrate (LOPRESSOR) 25 MG tablet Take 1 tablet (25 mg total) by mouth as needed. (Patient taking differently: Take 25 mg by mouth daily as needed (For increased heart rate.). ) 5 tablet 0  . Prenatal Vit-Fe Fumarate-FA (PRENATAL MULTIVITAMIN) TABS tablet Take 1 tablet by mouth daily at 12 noon.          Infusions: . sodium chloride 125 mL/hr at 09/09/14 [id number]    Allergies  Allergen Reactions  . Latex Itching    History   Social History  . Marital Status: Married    Spouse Name: N/A  . Number of Children: N/A  . Years of Education: N/A   Occupational History  . Not on file.   Social History Main Topics  . Smoking status: Never Smoker   . Smokeless tobacco: Never Used  . Alcohol Use: No  . Drug Use: No  . Sexual Activity: Yes    Birth Control/ Protection: None   Other Topics Concern  . Not on file   Social History Narrative    Family History  Problem Relation Age of Onset  . Asthma Brother   . Hypertension Maternal Grandmother   . Drug abuse Maternal Grandfather      PHYSICAL EXAM: Filed Vitals:   09/09/14 0709  BP: 100/84  Pulse: 139  Temp:   Resp:     No intake or output data in the 24 hours ending 09/09/14 0733  General:  Well appearing. No respiratory difficulty HEENT: normal Neck: supple. no JVD. Carotids 2+ bilat; no bruits. No lymphadenopathy or thryomegaly appreciated. Cor: PMI nondisplaced. Tachy Regular rate & rhythm. No rubs, gallops or murmurs. Lungs: clear Abdomen: soft, nontender, gravid . No hepatosplenomegaly. No bruits or masses. Good bowel sounds. Extremities: no cyanosis, clubbing, rash, 1+ edema Neuro: alert & oriented x 3, cranial nerves grossly intact. moves all 4 extremities w/o difficulty. Affect pleasant.  ECG: SVT 154   No results found for this or any previous visit (from the past 24 hour(s)). No results found.   ASSESSMENT: 1. PSVT 2. 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 Continue routine pre-partum care. If episodes recur could consider scheduled induction if felt appropriate by OB team.  Truman Haywardaniel Gagandeep Pettet,MD 7:38 AM

## 2014-09-09 NOTE — MAU Provider Note (Signed)
History     CSN: [id number]  Arrival date and time: 09/09/14 0509   First Provider Initiated Contact with Patient 09/09/14 0541      No chief complaint on file. CC: Heart racing HPI Amber Davenport 34 y.o. G2P0101  presents to MAU complaining of her heart racing.  This started a a couple hours ago.  She trialled valsalva maneuvers and po metoprolol right away with no response.  She denies any SOB, CP, vaginal bleeding, LOF, contractions, dysuria.  She endorses good FM.  She was seen for same here a few weeks ago and was cardioverted by cardiology fellow using  adenosine.   OB History    Gravida Para Term Preterm AB TAB SAB Ectopic Multiple Living        Past Medical History  Diagnosis Date  . Anemia   . Diabetes mellitus without complication     diet and oral hypoglycemic control  . Gestational diabetes   . Dysrhythmia 2015    rapid heartbeat, cardioverted twice in last three weeks using Adenosine.  . Irritable bowel syndrome     since high school  . Hypertension   . SVT (supraventricular tachycardia) 07-2014    during current pregnancy    Past Surgical History  Procedure Laterality Date  . Mouth surgery  2004    Family History  Problem Relation Age of Onset  . Asthma Brother   . Hypertension Maternal Grandmother   . Drug abuse Maternal Grandfather     History  Substance Use Topics  . Smoking status: Never Smoker   . Smokeless tobacco: Never Used  . Alcohol Use: No    Allergies:  Allergies  Allergen Reactions  . Latex Itching    Prescriptions prior to admission  Medication Sig Dispense Refill Last Dose  . glyBURIDE (DIABETA) 1.25 MG tablet Take 1.25 mg by mouth at bedtime.   09/08/2014 at Unknown time  . glyBURIDE (DIABETA) 2.5 MG tablet Take 2.5 mg by mouth daily with breakfast.   09/08/2014 at Unknown time  . metoprolol tartrate (LOPRESSOR) 25 MG tablet Take 1 tablet (25 mg total) by mouth as needed. (Patient taking  differently: Take 25 mg by mouth daily as needed (For increased heart rate.). ) 5 tablet 0 09/09/2014 at 0330  . Prenatal Vit-Fe Fumarate-FA (PRENATAL MULTIVITAMIN) TABS tablet Take 1 tablet by mouth daily at 12 noon.   09/08/2014 at Unknown time    ROS Pertinent ROS in HPI  Physical Exam   Blood pressure 128/98, pulse 154, temperature 97.6 F (36.4 C), resp. rate 18, height 5' 4.5" (1.638 m), weight 233 lb (105.688 kg), SpO2 100 %, unknown if currently breastfeeding.  Physical Exam  Constitutional: She is oriented to person, place, and time. She appears well-developed and well-nourished. No distress.  HENT:  Head: Normocephalic and atraumatic.  Eyes: EOM are normal.  Neck: Normal range of motion.  Cardiovascular:  Tachycardic  Respiratory: Effort normal. No respiratory distress.  Musculoskeletal: Normal range of motion.  Neurological: She is alert and oriented to person, place, and time.  Skin: Skin is warm and dry.  Psychiatric: She has a normal mood and affect.    MAU Course  Procedures  MDM Discussed with Dr. Dion Body.  She agrees it is appropriate to call cardiology for oversight of cardioversion however if they are unavailable, she can come in herself.   After much difficulty finding a cardiology professional to accept pt, at 6:20am PA Hal  Meng agrees to contact [facility]   Dr. Charlotta Newtonzan has called after speaking with Dr. Dion BodyVarnado.  She is on call OB for this pt.  She agrees for cardiology to come and see pt to manage cardioversion.   Several minutes later Gillie MannersHal Meng, PA calls back, stating it ay be an hour or more before MD can come and he advises to give Lopressor IV 5mg  Q 5 min PRN up to 3 doses for SBP >110.   Dr. Charlotta Newtonzan consulted and she is in agreement to give Lopressor.  6:45 Cardiology fellow Shirlee LatchMcLean has called and reports he will be available shortly.   6:50 Dr. Charlotta Newtonzan to MAU, introduces self to pt, she is in the building should need for her arise.  Still awaiting  medication.   1st dose of Metoprolol IV given.  Cardioversion did not occur and her blood pressure dropped significantly.  Her husband (and pt in agreement) state no more metoprolol.  They ask to await adenosine which was effective previously.   7:05 - arrival of cardiology: Dr. Gala RomneyBensimhon - also in attendance: Dr. Charlotta Newtonzan - Adenosine 6mg  IV flush given.  Pt tolerated well and cardioverted without difficulty.   7:35 - Dr. Charlotta Newtonzan agrees that after a 30-45 minute observation period, pt may be discharged to home with follow up early next week.    Assessment and Plan  A: SVT in pregnancy - cardioverted  P: Discharge to home Follow up in clinic as scheduled early next week Patient may return to MAU as needed or if her condition were to change or worsen   Bertram Denvereague Clark, Karen E 09/09/2014, 5:41 AM

## 2014-09-09 NOTE — MAU Note (Addendum)
PT  SAYS   SHE AWOKE  AND  FELT  PALPITATIONS     AT 0315    AND  TOOK  MEDS  AT 0330-  METOPROLOL.  SHE WAS HERE   3 WEEKS  AGO   FOR  SAME.   HAS NOT  HAD A  CARDIOLOGISTS     DENIES  CHEST PAIN

## 2014-09-11 ENCOUNTER — Inpatient Hospital Stay (HOSPITAL_COMMUNITY)
Admission: AD | Admit: 2014-09-11 | Discharge: 2014-09-13 | DRG: 775 | Disposition: A | Discharge status: Home / Self Care | Payer: 59 | Source: Ambulatory Visit | Attending: Obstetrics & Gynecology | Admitting: Obstetrics & Gynecology

## 2014-09-11 ENCOUNTER — Encounter (HOSPITAL_COMMUNITY): Payer: Self-pay

## 2014-09-11 DIAGNOSIS — O471 False labor at or after 37 completed weeks of gestation: Secondary | ICD-10-CM | POA: Diagnosis present

## 2014-09-11 DIAGNOSIS — Z79899 Other long term (current) drug therapy: Secondary | ICD-10-CM | POA: Diagnosis not present

## 2014-09-11 DIAGNOSIS — IMO0001 Reserved for inherently not codable concepts without codable children: Secondary | ICD-10-CM

## 2014-09-11 DIAGNOSIS — Z3A38 38 weeks gestation of pregnancy: Secondary | ICD-10-CM | POA: Diagnosis present

## 2014-09-11 DIAGNOSIS — O24419 Gestational diabetes mellitus in pregnancy, unspecified control: Secondary | ICD-10-CM | POA: Diagnosis present

## 2014-09-11 DIAGNOSIS — O99824 Streptococcus B carrier state complicating childbirth: Secondary | ICD-10-CM | POA: Diagnosis present

## 2014-09-11 DIAGNOSIS — O4292 Full-term premature rupture of membranes, unspecified as to length of time between rupture and onset of labor: Secondary | ICD-10-CM | POA: Diagnosis present

## 2014-09-11 DIAGNOSIS — O26873 Cervical shortening, third trimester: Secondary | ICD-10-CM | POA: Diagnosis present

## 2014-09-11 LAB — CBC
HCT: 36.5 % (ref 36.0–46.0)
HCT: 33.6 % — ABNORMAL LOW (ref 36.0–46.0)
HEMOGLOBIN: 12.3 g/dL (ref 12.0–15.0)
Hemoglobin: 11.2 g/dL — ABNORMAL LOW (ref 12.0–15.0)
MCH: 28.5 pg (ref 26.0–34.0)
MCH: 28 pg (ref 26.0–34.0)
MCHC: 33.7 g/dL (ref 30.0–36.0)
MCHC: 33.3 g/dL (ref 30.0–36.0)
MCV: 84.5 fL (ref 78.0–100.0)
MCV: 84 fL (ref 78.0–100.0)
Platelets: 161 10*3/uL (ref 150–400)
Platelets: 149 10*3/uL — ABNORMAL LOW (ref 150–400)
RBC: 4.32 MIL/uL (ref 3.87–5.11)
RBC: 4 MIL/uL (ref 3.87–5.11)
RDW: 14 % (ref 11.5–15.5)
RDW: 14.1 % (ref 11.5–15.5)
WBC: 11.5 10*3/uL — AB (ref 4.0–10.5)
WBC: 12.6 10*3/uL — AB (ref 4.0–10.5)

## 2014-09-11 LAB — PROTEIN / CREATININE RATIO, URINE
Creatinine, Urine: 158 mg/dL
PROTEIN CREATININE RATIO: 0.09 (ref 0.00–0.15)
Total Protein, Urine: 14 mg/dL

## 2014-09-11 LAB — URINALYSIS, ROUTINE W REFLEX MICROSCOPIC
Bilirubin Urine: NEGATIVE
Glucose, UA: NEGATIVE mg/dL
Ketones, ur: 15 mg/dL — AB
Nitrite: POSITIVE — AB
Protein, ur: NEGATIVE mg/dL
Specific Gravity, Urine: 1.025 (ref 1.005–1.030)
UROBILINOGEN UA: 0.2 mg/dL (ref 0.0–1.0)
pH: 6 (ref 5.0–8.0)

## 2014-09-11 LAB — TYPE AND SCREEN
ABO/RH(D): O POS
ANTIBODY SCREEN: NEGATIVE

## 2014-09-11 LAB — COMPREHENSIVE METABOLIC PANEL
ALBUMIN: 2.9 g/dL — AB (ref 3.5–5.2)
ALT: 30 U/L (ref 0–35)
ANION GAP: 4 — AB (ref 5–15)
AST: 37 U/L (ref 0–37)
Alkaline Phosphatase: 84 U/L (ref 39–117)
BUN: 6 mg/dL (ref 6–23)
CO2: 23 mmol/L (ref 19–32)
Calcium: 9.2 mg/dL (ref 8.4–10.5)
Chloride: 110 mmol/L (ref 96–112)
Creatinine, Ser: 0.54 mg/dL (ref 0.50–1.10)
GFR calc non Af Amer: >90 mL/min (ref >90)
Glucose, Bld: 82 mg/dL (ref 70–99)
Potassium: 3.8 mmol/L (ref 3.5–5.1)
SODIUM: 137 mmol/L (ref 135–145)
Total Bilirubin: 0.4 mg/dL (ref 0.3–1.2)
Total Protein: 5.8 g/dL — ABNORMAL LOW (ref 6.0–8.3)

## 2014-09-11 LAB — URINE MICROSCOPIC-ADD ON

## 2014-09-11 LAB — GLUCOSE, CAPILLARY
GLUCOSE-CAPILLARY: 141 mg/dL — AB (ref 70–99)
GLUCOSE-CAPILLARY: 79 mg/dL (ref 70–99)
GLUCOSE-CAPILLARY: 129 mg/dL — AB (ref 70–99)

## 2014-09-11 LAB — URIC ACID: Uric Acid, Serum: 4.1 mg/dL (ref 2.4–7.0)

## 2014-09-11 MED ORDER — PRENATAL MULTIVITAMIN CH
1.0000 | ORAL_TABLET | Freq: Every day | ORAL | Status: DC
  Administered 2014-09-11 – 2014-09-13 (×3): 1 via ORAL
  Filled 2014-09-11 (×3): qty 1

## 2014-09-11 MED ORDER — OXYTOCIN 10 UNIT/ML IJ SOLN
INTRAMUSCULAR | Status: AC
  Filled 2014-09-11: qty 1

## 2014-09-11 MED ORDER — BENZOCAINE-MENTHOL 20-0.5 % EX AERO
1.0000 "application " | INHALATION_SPRAY | CUTANEOUS | Status: DC | PRN
  Administered 2014-09-12: 1 via TOPICAL
  Filled 2014-09-11: qty 56

## 2014-09-11 MED ORDER — DIBUCAINE 1 % RE OINT: 1.0000 "application " | TOPICAL_OINTMENT | RECTAL | Status: DC | PRN

## 2014-09-11 MED ORDER — OXYCODONE-ACETAMINOPHEN 5-325 MG PO TABS: 1.0000 | ORAL_TABLET | ORAL | Status: DC | PRN

## 2014-09-11 MED ORDER — ONDANSETRON HCL 4 MG/2ML IJ SOLN: 4.0000 mg | Freq: Four times a day (QID) | INTRAMUSCULAR | Status: DC | PRN

## 2014-09-11 MED ORDER — SIMETHICONE 80 MG PO CHEW: 80.0000 mg | CHEWABLE_TABLET | ORAL | Status: DC | PRN

## 2014-09-11 MED ORDER — WITCH HAZEL-GLYCERIN EX PADS: 1.0000 "application " | MEDICATED_PAD | CUTANEOUS | Status: DC | PRN

## 2014-09-11 MED ORDER — LANOLIN HYDROUS EX OINT: TOPICAL_OINTMENT | CUTANEOUS | Status: DC | PRN

## 2014-09-11 MED ORDER — OXYTOCIN BOLUS FROM INFUSION: 500.0000 mL | INTRAVENOUS | Status: DC

## 2014-09-11 MED ORDER — IBUPROFEN 600 MG PO TABS
600.0000 mg | ORAL_TABLET | Freq: Four times a day (QID) | ORAL | Status: DC
  Administered 2014-09-11 – 2014-09-13 (×7): 600 mg via ORAL
  Filled 2014-09-11 (×8): qty 1

## 2014-09-11 MED ORDER — ZOLPIDEM TARTRATE 5 MG PO TABS: 5.0000 mg | ORAL_TABLET | Freq: Every evening | ORAL | Status: DC | PRN

## 2014-09-11 MED ORDER — OXYTOCIN 40 UNITS IN LACTATED RINGERS INFUSION - SIMPLE MED
INTRAVENOUS | Status: AC
  Filled 2014-09-11: qty 1000

## 2014-09-11 MED ORDER — LACTATED RINGERS IV SOLN: 500.0000 mL | INTRAVENOUS | Status: DC | PRN

## 2014-09-11 MED ORDER — OXYCODONE-ACETAMINOPHEN 5-325 MG PO TABS: 2.0000 | ORAL_TABLET | ORAL | Status: DC | PRN

## 2014-09-11 MED ORDER — SENNOSIDES-DOCUSATE SODIUM 8.6-50 MG PO TABS
2.0000 | ORAL_TABLET | ORAL | Status: DC
  Administered 2014-09-13: 2 via ORAL
  Filled 2014-09-11 (×2): qty 2

## 2014-09-11 MED ORDER — LIDOCAINE HCL (PF) 1 % IJ SOLN
INTRAMUSCULAR | Status: AC
  Administered 2014-09-11: 30 mL
  Filled 2014-09-11: qty 30

## 2014-09-11 MED ORDER — ONDANSETRON HCL 4 MG PO TABS: 4.0000 mg | ORAL_TABLET | ORAL | Status: DC | PRN

## 2014-09-11 MED ORDER — OXYTOCIN 40 UNITS IN LACTATED RINGERS INFUSION - SIMPLE MED: 62.5000 mL/h | INTRAVENOUS | Status: DC

## 2014-09-11 MED ORDER — CITRIC ACID-SODIUM CITRATE 334-500 MG/5ML PO SOLN: 30.0000 mL | ORAL | Status: DC | PRN

## 2014-09-11 MED ORDER — ONDANSETRON HCL 4 MG/2ML IJ SOLN: 4.0000 mg | INTRAMUSCULAR | Status: DC | PRN

## 2014-09-11 MED ORDER — BUTORPHANOL TARTRATE 1 MG/ML IJ SOLN
INTRAMUSCULAR | Status: AC
  Filled 2014-09-11: qty 2

## 2014-09-11 MED ORDER — ACETAMINOPHEN 325 MG PO TABS: 650.0000 mg | ORAL_TABLET | ORAL | Status: DC | PRN

## 2014-09-11 MED ORDER — DIPHENHYDRAMINE HCL 25 MG PO CAPS: 25.0000 mg | ORAL_CAPSULE | Freq: Four times a day (QID) | ORAL | Status: DC | PRN

## 2014-09-11 MED ORDER — OXYTOCIN 40 UNITS IN LACTATED RINGERS INFUSION - SIMPLE MED: 62.5000 mL/h | INTRAVENOUS | Status: DC | PRN

## 2014-09-11 MED ORDER — TETANUS-DIPHTH-ACELL PERTUSSIS 5-2.5-18.5 LF-MCG/0.5 IM SUSP: 0.5000 mL | Freq: Once | INTRAMUSCULAR | Status: DC

## 2014-09-11 MED ORDER — LACTATED RINGERS IV SOLN: INTRAVENOUS | Status: DC

## 2014-09-11 MED ORDER — LIDOCAINE HCL (PF) 1 % IJ SOLN
30.0000 mL | INTRAMUSCULAR | Status: DC | PRN
  Filled 2014-09-11: qty 30

## 2014-09-11 MED ORDER — BUTORPHANOL TARTRATE 1 MG/ML IJ SOLN
2.0000 mg | Freq: Once | INTRAMUSCULAR | Status: AC
  Administered 2014-09-11: 2 mg via INTRAVENOUS

## 2014-09-11 MED ORDER — NALBUPHINE HCL 10 MG/ML IJ SOLN: 10.0000 mg | INTRAMUSCULAR | Status: DC | PRN

## 2014-09-11 NOTE — Progress Notes (Deleted)
OB PN:  S: Patient desires permanent sterilization.   Bilateral tubal ligation reviewed with R&B including but not limited to bleeding, infection, injury to other organs, irreversibility and failure rate of 1/500-07/998. The absence of effect on future cycle and PMS also discussed. Questions answered and inform consent obtained.  Myna HidalgoJennifer Megean Fabio, DO [phone] (pager) [phone] (office)

## 2014-09-11 NOTE — H&P (Signed)
Late entry for 0600: O: 33 y/o G3P0111 @ 3012w1d estimated gestational age who in active labor and noted to be complete on admission.  Contractions started around 4:30am, SROM around 5am.  No VB, +FM.  ROS: no HA, no epigastric pain, no visual changes.    Pregnancy complicated by: 1) h/o PPROM @ 27wk   s/p 17-P weekly 2) short cervix- US @ 31wk showed dynamic cervix 1.4cm-2.3cm with funneling  pt has been on bedrest 3) GDMA2- Glyburide increased to 2.5mg  in am and 1.25mg  in pm 4) UTI- s/p tx with Keflex, TOC negative 5) GBS positive 6) Allergies: LATEX  Prenatal Transfer Tool  Maternal Diabetes: Yes:  Diabetes Type:  Insulin/Medication controlled Genetic Screening: Normal Maternal Ultrasounds/Referrals: Normal Fetal Ultrasounds or other Referrals:  None Maternal Substance Abuse:  No Significant Maternal Medications:  Meds include: Progesterone, glyburide Significant Maternal Lab Results: None   PNL:  GBS unknown, Rub Immune, Hep B neg, RPR NR, HIV neg, GC/C neg, glucola:abnormal Hgb 11.1 Blood type: O positive, antibody negative  OBHx: SAB x 1, @ 27wk- PPROM, SVD, 3#0, Female PMHx:  See above Meds:  PNV, glyburide Allergy:   Allergies  Allergen Reactions  . Latex Itching   SurgHx: none SocHx:   no Tobacco, no  EtOH, no Illicit Drugs  O: BP 138/64 mmHg  Pulse 101  Gen. AAOx3, NAD CV.  RRR   Abd. Gravid Extr.  1+ non-pitting edema B/L , no calf tenderness  FHT: 125, moderate variability Toco: q2-323min SVE: C/C/+3- actively pushing  A/P:  34 y.o. [id number] @ 4877w3d -FWB:  Cat. I reassuring  -Labor: expectant management -GDMA2- will continue with fasting and 2hr postprandial -h/o PPROM and short cervix, continue with modified bed rest -SVT in pregnancy- metoprolol 25mg  prn, currently asymptomatic.  Last conversion on Friday (2/12) -Prenatal care- continue PNV daily, tylenol prn -GBS positive, pt did not receive antibiotics due to imminent delivery  Amber HidalgoJennifer Samyah Bilbo,  DO [phone] (pager) [phone] (office)

## 2014-09-11 NOTE — MAU Note (Signed)
Pt's husband called for wheelchair at MAU entrance.  Pt to room 7.  Reports water broke about 20 min ago.  Clear fluid with pink tinge. Having contractions.

## 2014-09-11 NOTE — Progress Notes (Signed)
Amber Davenport MRN: [id number]  Subjective: -Nurse call report patient complete and with urge to push.  In room to assess and introduce self to patient.  Patient pushing spontaneously with contractions.  Husband at bedside and reports SROM ~7425minutes ago, unknown onset of contractions, GBS positive, and GDM.    Objective: There were no vitals taken for this visit.     FHT: 125  By dopppler UC:  Palpates moderates SVE:    C/C/+2 Membranes: SROM Pitocin: None  Assessment:  IUP at 38.1wks Cat I FT  2nd Stage Labor GDM GBS Positive  Plan: -Dr. Katharine LookJ. Ozan notified and en route  During interim:  -Remained at bedside until Dr. Charlotta Newtonzan in room -Nurses instructed to proceed with standard admission orders including IV and labs -Labor and Delivery orders placed per hospital policy -Continue other mgmt as ordered -Anticipate a SVD  Othar Curto LYNN,MSN, CNM 09/11/2014, 5:57 AM

## 2014-09-11 NOTE — Progress Notes (Signed)
Dr Charlotta Newtonzan notified of pt's admission and status. Will come for delivery. Gerrit HeckJessica Emly CNM in-house and aware.

## 2014-09-11 NOTE — Lactation Note (Signed)
This note was copied from the chart of Amber Davenport. Lactation Consultation Note  Mom is planning to express her BM and put it into bottles.  She did this with her previous baby.  We discussed frequency and duration of pumping. I also recommended adding hand expression.  She stated she will call for assistance the next time that she is pumping so that we can review hand expression.  Patient Name: Amber Clarisa Kindrederesita Helmes JYNWG'NToday's Date: 09/11/2014     Maternal Data    Feeding Feeding Type: Bottle Fed - Formula  LATCH Score/Interventions                      Lactation Tools Discussed/Used     Consult Status      Soyla DryerJoseph, Gavin Faivre 09/11/2014, 3:03 PM

## 2014-09-12 LAB — CBC
HEMATOCRIT: 30.8 % — AB (ref 36.0–46.0)
Hemoglobin: 10.2 g/dL — ABNORMAL LOW (ref 12.0–15.0)
MCH: 28 pg (ref 26.0–34.0)
MCHC: 33.1 g/dL (ref 30.0–36.0)
MCV: 84.6 fL (ref 78.0–100.0)
Platelets: 143 10*3/uL — ABNORMAL LOW (ref 150–400)
RBC: 3.64 MIL/uL — AB (ref 3.87–5.11)
RDW: 14.2 % (ref 11.5–15.5)
WBC: 11 10*3/uL — ABNORMAL HIGH (ref 4.0–10.5)

## 2014-09-12 LAB — GLUCOSE, CAPILLARY
Glucose-Capillary: 88 mg/dL (ref 70–99)
Glucose-Capillary: 116 mg/dL — ABNORMAL HIGH (ref 70–99)
Glucose-Capillary: 131 mg/dL — ABNORMAL HIGH (ref 70–99)
Glucose-Capillary: 137 mg/dL — ABNORMAL HIGH (ref 70–99)

## 2014-09-12 LAB — RPR: RPR Ser Ql: NONREACTIVE

## 2014-09-12 MED ORDER — METOPROLOL TARTRATE 25 MG PO TABS
25.0000 mg | ORAL_TABLET | ORAL | Status: DC | PRN
  Filled 2014-09-12: qty 1

## 2014-09-12 NOTE — Progress Notes (Signed)
Postpartum day #1, NSVD  Subjective Pt without complaints.  Lochia normal.  Pain controlled.  Breast feeding no  Temp:  [97.9 F (36.6 C)-98.3 F (36.8 C)] 98.1 F (36.7 C) (02/15 0522) Pulse Rate:  [86-92] 86 (02/15 0522) Resp:  [18] 18 (02/15 0522) BP: (124-141)/(65-74) 124/65 mmHg (02/15 0522) SpO2:  [100 %] 100 % (02/15 0522)  Gen:  NAD, A&O x 3 Uterine fundus:  Firm, nontender Lochia normal Ext:  2-3+Edema, no calf tenderness bilaterally  CBC    Component Value Date/Time   WBC 11.0* 09/12/2014 0555   RBC 3.64* 09/12/2014 0555   HGB 10.2* 09/12/2014 0555   HCT 30.8* 09/12/2014 0555   PLT 143* 09/12/2014 0555   MCV 84.6 09/12/2014 0555   MCH 28.0 09/12/2014 0555   MCHC 33.1 09/12/2014 0555   RDW 14.2 09/12/2014 0555     A/P: S/p SVD doing well. Routine postpartum care. Lactation support. Discharge in am. CBGs AC meals and at bedtime.  Diet controlled for now.  Resume Glyburide prn. Outpatient circumcision.  Lalo Tromp 09/12/2014, 10:07 AM

## 2014-09-12 NOTE — Progress Notes (Signed)
Dr Levora AngelVanardo notified that CBG's taken today were 2 hours after meal instead of prior to meal.Fasting value=88, 2 hours after breakfast =116 and 2 hours after lunch =131. The 2 hour after lunch was at 3:30 and patients evening meal is now at the bedside. We will consider the afternoon 131 as prior to  Evening meal as she intends to eat again soon.

## 2014-09-12 NOTE — Plan of Care (Signed)
Problem: Discharge Progression Outcomes Goal: Barriers To Progression Addressed/Resolved Outcome: Progressing Patient has history of SVT,declines symptoms at this time. Md has lopressor ordered as PRN. Last pulse 96

## 2014-09-12 NOTE — Discharge Instructions (Signed)

## 2014-09-13 LAB — GLUCOSE, CAPILLARY: GLUCOSE-CAPILLARY: 96 mg/dL (ref 70–99)

## 2014-09-13 MED ORDER — IBUPROFEN 600 MG PO TABS: 600.0000 mg | ORAL_TABLET | Freq: Four times a day (QID) | ORAL | Status: DC

## 2014-09-13 NOTE — Discharge Summary (Signed)
Obstetric Discharge Summary Reason for Admission: onset of labor Prenatal Procedures: NST and ultrasound Intrapartum Procedures: spontaneous vaginal delivery Postpartum Procedures: none Complications-Operative and Postpartum: 2nd degree perineal laceration HEMOGLOBIN  Date Value Ref Range Status  09/12/2014 10.2* 12.0 - 15.0 g/dL Final   HCT  Date Value Ref Range Status  09/12/2014 30.8* 36.0 - 46.0 % Final    Physical Exam:  General: alert, cooperative and no distress Lochia: appropriate Uterine Fundus: firm Incision: n/a DVT Evaluation: Calf/Ankle edema is present.  Discharge Diagnoses: Term Pregnancy-delivered  Discharge Information: Date: 09/13/2014 Activity: pelvic rest Diet: routine Medications: PNV and Ibuprofen Condition: improved Instructions: See discharge instructions. Discharge to: home Follow-up Information    Follow up with Geryl RankinsVARNADO, Messina Kosinski, MD. Schedule an appointment as soon as possible for a visit in 6 weeks.   Specialty:  Obstetrics and Gynecology   Why:  Postpartum check up   Contact information:   [address] [place] [address] [phone]       Follow up with Geryl RankinsVARNADO, Michaila Kenney, MD. Schedule an appointment as soon as possible for a visit in 1 week.   Specialty:  Obstetrics and Gynecology   Why:  BP Check   Contact information:   [address] [place] [address] [phone]       Newborn Data: Live born female  Birth Weight: 6 lb 4.2 oz (2840 g) APGAR: 9, 9  Home with mother.  Geryl RankinsVARNADO, Amany Rando 09/13/2014, 8:46 AM

## 2014-09-20 ENCOUNTER — Inpatient Hospital Stay (HOSPITAL_COMMUNITY): Admission: RE | Admit: 2014-09-20 | Payer: 59 | Source: Ambulatory Visit

## 2014-10-20 ENCOUNTER — Telehealth: Payer: Self-pay | Admitting: Cardiovascular Disease

## 2014-10-20 NOTE — Telephone Encounter (Signed)
Closed encounter °

## 2014-10-21 ENCOUNTER — Encounter: Payer: Self-pay | Admitting: Cardiovascular Disease

## 2014-10-21 ENCOUNTER — Ambulatory Visit (INDEPENDENT_AMBULATORY_CARE_PROVIDER_SITE_OTHER): Payer: 59 | Admitting: Cardiovascular Disease

## 2014-10-21 VITALS — BP 134/98 | HR 72 | Resp 20 | Ht 64.5 in | Wt 209.4 lb

## 2014-10-21 DIAGNOSIS — I471 Supraventricular tachycardia: Secondary | ICD-10-CM | POA: Diagnosis not present

## 2014-10-21 NOTE — Progress Notes (Signed)
Patient ID: Amber Davenport, female   DOB: 1980/12/11, 34 y.o.   MRN: [id number]     Cardiology Office Note   Date:  10/21/2014   ID:  Amber Davenport, DOB 1981-02-24, MRN [id number]  PCP:  Fortino Sic, MD  Cardiologist:   Thurmon Fair, MD   Chief Complaint  Patient presents with  . Follow-up    3 months:  No palpitations since the birth of her son.  N ocomplaints.      History of Present Illness: Amber Davenport is a 34 y.o. female who presents for follow-up of SVT. She had three symptomatic events during the last trimester of her second pregnancy, none following delivery, despite no pharmacological therapy. All episodes responded to IV adenosine promptly, but not to IV metoprolol. She feels well. No evidence of structural heart disease.    Past Medical History  Diagnosis Date  . Anemia   . Diabetes mellitus without complication     diet and oral hypoglycemic control  . Gestational diabetes   . Dysrhythmia 2015    rapid heartbeat, cardioverted twice in last three weeks using Adenosine.  . Irritable bowel syndrome     since high school  . SVT (supraventricular tachycardia) 07-2014    during current pregnancy    Past Surgical History  Procedure Laterality Date  . Mouth surgery  2004     Current Outpatient Prescriptions  Medication Sig Dispense Refill  . metoprolol tartrate (LOPRESSOR) 25 MG tablet Take 1 tablet (25 mg total) by mouth daily as needed (For increased heart rate.). 5 tablet 0   No current facility-administered medications for this visit.    Allergies:   Latex    Social History:  The patient  reports that she has never smoked. She has never used smokeless tobacco. She reports that she does not drink alcohol or use illicit drugs.   Family History:  The patient's   family history includes Asthma in her brother; Drug abuse in her maternal grandfather; Hypertension in her maternal grandmother.    ROS:  Please see the history of present illness.     The patient specifically denies any chest pain at rest or with exertion, dyspnea at rest or with exertion, orthopnea, paroxysmal nocturnal dyspnea, syncope, palpitations, focal neurological deficits, intermittent claudication, lower extremity edema, unexplained weight gain, cough, hemoptysis or wheezing.  The patient also denies abdominal pain, nausea, vomiting, dysphagia, diarrhea, constipation, polyuria, polydipsia, dysuria, hematuria, frequency, urgency, abnormal bleeding or bruising, fever, chills, unexpected weight changes, mood swings, change in skin or hair texture, change in voice quality, auditory or visual problems, allergic reactions or rashes, new musculoskeletal complaints other than usual "aches and pains".  Otherwise, review of systems positive for none.   All other systems are reviewed and negative.    PHYSICAL EXAM: VS:  BP 134/98 mmHg  Pulse 72  Ht 5' 4.5" (1.638 m)  Wt 209 lb 6.4 oz (94.983 kg)  BMI 35.40 kg/m2 , BMI Body mass index is 35.4 kg/(m^2).  General: Alert, oriented x3, no distress Head: no evidence of trauma, PERRL, EOMI, no exophtalmos or lid lag, no myxedema, no xanthelasma; normal ears, nose and oropharynx Neck: normal jugular venous pulsations and no hepatojugular reflux; brisk carotid pulses without delay and no carotid bruits Chest: clear to auscultation, no signs of consolidation by percussion or palpation, normal fremitus, symmetrical and full respiratory excursions Cardiovascular: normal position and quality of the apical impulse, regular rhythm, normal first and second heart sounds, no murmurs, rubs or gallops Abdomen: no  tenderness or distention, no masses by palpation, no abnormal pulsatility or arterial bruits, normal bowel sounds, no hepatosplenomegaly Extremities: no clubbing, cyanosis or edema; 2+ radial, ulnar and brachial pulses bilaterally; 2+ right femoral, posterior tibial and dorsalis pedis pulses; 2+ left femoral, posterior tibial and  dorsalis pedis pulses; no subclavian or femoral bruits Neurological: grossly nonfocal Psych: euthymic mood, full affect   EKG:  EKG is not ordered today.   Recent Labs: 07/25/2014: TSH 2.553 08/10/2014: Magnesium 1.6 09/11/2014: ALT 30; BUN 6; Creatinine 0.54; Potassium 3.8; Sodium 137 09/12/2014: Hemoglobin 10.2*; Platelets 143*    Lipid Panel No results found for: CHOL, TRIG, HDL, CHOLHDL, VLDL, LDLCALC, LDLDIRECT    Wt Readings from Last 3 Encounters:  10/21/14 209 lb 6.4 oz (94.983 kg)  09/11/14 233 lb (105.688 kg)  09/09/14 233 lb (105.688 kg)      Other studies Reviewed: Additional studies/ records that were reviewed today include: records of ED evaluation and treatment.  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 No need for maintenance pharmacological therapy at this time, but explained this may become necessary as she ages. No indication for EPS/RFA at this time. Reminded of vagal maneuvers (she was instructed not to perform Valsalva during pregnancy).   Current medicines are reviewed at length with the patient today.  The patient does not have concerns regarding medicines.  The following changes have been made:  no change  Labs/ tests ordered today include:  No orders of the defined types were placed in this encounter.    Patient Instructions  Dr.Keaton Stirewalt wants you to follow-up in: ONE YEAR. You will receive a reminder letter in the mail two months in advance. If you don't receive a letter, please call our office to schedule the follow-up appointment.     Joie BimlerSigned, Meighan Treto, MD  10/21/2014 6:02 PM    Thurmon FairMihai Torina Ey, MD, [facility] [phone] office [phone] pager

## 2014-10-21 NOTE — Patient Instructions (Signed)
Dr.Croitoru  wants you to follow-up in: ONE YEAR. You will receive a reminder letter in the mail two months in advance. If you don't receive a letter, please call our office to schedule the follow-up appointment.  

## 2014-11-05 ENCOUNTER — Emergency Department (HOSPITAL_COMMUNITY)
Admission: EM | Admit: 2014-11-05 | Discharge: 2014-11-06 | Disposition: A | Discharge status: Home / Self Care | Payer: 59 | Attending: Emergency Medicine | Admitting: Emergency Medicine

## 2014-11-05 ENCOUNTER — Encounter (HOSPITAL_COMMUNITY): Payer: Self-pay

## 2014-11-05 DIAGNOSIS — Z91040 Latex allergy status: Secondary | ICD-10-CM | POA: Diagnosis not present

## 2014-11-05 DIAGNOSIS — Z8632 Personal history of gestational diabetes: Secondary | ICD-10-CM | POA: Insufficient documentation

## 2014-11-05 DIAGNOSIS — Z79899 Other long term (current) drug therapy: Secondary | ICD-10-CM | POA: Diagnosis not present

## 2014-11-05 DIAGNOSIS — I471 Supraventricular tachycardia: Secondary | ICD-10-CM | POA: Diagnosis not present

## 2014-11-05 DIAGNOSIS — Z8719 Personal history of other diseases of the digestive system: Secondary | ICD-10-CM | POA: Diagnosis not present

## 2014-11-05 DIAGNOSIS — Z862 Personal history of diseases of the blood and blood-forming organs and certain disorders involving the immune mechanism: Secondary | ICD-10-CM | POA: Insufficient documentation

## 2014-11-05 DIAGNOSIS — E119 Type 2 diabetes mellitus without complications: Secondary | ICD-10-CM | POA: Diagnosis not present

## 2014-11-05 DIAGNOSIS — R000 Tachycardia, unspecified: Secondary | ICD-10-CM | POA: Diagnosis present

## 2014-11-05 NOTE — ED Notes (Signed)
Patient reports that she noticed her heart was beating really fast when she was pumping breast milk earlier today.  She has a history of SVT, but hasn't had any episodes since her son was born 2/14.  She took the medication that she was given and reports feeling a lot better now.  Denies dizziness, SHOB, CP.

## 2014-11-06 MED ORDER — ADENOSINE 6 MG/2ML IV SOLN
12.0000 mg | Freq: Once | INTRAVENOUS | Status: AC
  Administered 2014-11-06: 12 mg via INTRAVENOUS

## 2014-11-06 MED ORDER — ADENOSINE 6 MG/2ML IV SOLN
6.0000 mg | Freq: Once | INTRAVENOUS | Status: AC
  Administered 2014-11-06: 6 mg via INTRAVENOUS
  Filled 2014-11-06: qty 2

## 2014-11-06 MED ORDER — ADENOSINE 6 MG/2ML IV SOLN
12.0000 mg | Freq: Once | INTRAVENOUS | Status: DC
Start: 2014-11-06 — End: 2014-11-06

## 2014-11-06 NOTE — Discharge Instructions (Signed)
If it happens again, take the metoprolol and wait 1 hour, repeat if your heart is still racing. If not improving in the second hour go to the ED. Call Dr Croitoru's office to let him know you had to come to the ED for your heart racing again.    Supraventricular Tachycardia Supraventricular tachycardia (SVT) is when the heart beats very fast. SVT can last for a long time (sustained) or it can start and stop suddenly (nonsustained). HOME CARE   Take your heart medicine as told by your doctor. Check with your doctor before taking cold, diet, or herbal medicine.  Do not smoke.  Do not drink large amounts of caffeine.Caffeine is found in coffee, tea, soda (pop, cola), and chocolate.  Keep all doctor visits as told. GET HELP RIGHT AWAY IF:   You have chest pain or pressure.  You cannot catch your breath.  You are dizzy or lightheaded.  You feel like you will pass out (faint).  You are sweaty (diaphoretic) and feel sick to your stomach (nauseous) or throw up (vomit).  If you have the above problems, call your local emergency services (911 in [place] right away. Do not drive yourself to the hospital. MAKE SURE YOU:   Understand these instructions.  Will watch your condition.  Will get help right away if you are not doing well or get worse. Document Released: 07/15/2005 Document Revised: 10/07/2011 Document Reviewed: 10/19/2008 [facility] Patient Information 2015 [employer] [place]. This information is not intended to replace advice given to you by your health care provider. Make sure you discuss any questions you have with your health care provider.

## 2014-11-06 NOTE — ED Provider Notes (Signed)
CSN: [id number]     Arrival date & time 11/05/14  2340 History   First MD Initiated Contact with Patient 11/06/14 0002     Chief Complaint  Patient presents with  . Tachycardia     (Consider location/radiation/quality/duration/timing/severity/associated sxs/prior Treatment) HPI  Patient reports about 10:30 this evening she was doing her breast pumping and had acute onset of feeling her heart racing. She denies chest pain, shortness of breath, dizziness or lightheadedness. She says she had this before at the end of her last pregnancy. She had a total of 3 episodes.  She states at that time her heart rate was 160-170. Patient is G2 P2 Ab0. She states when she was in the hospital they had to use a couple different medications and she saw a cardiologist. She states they gave her medication to use if that happened at home. She states she took metoprolol 25 mg about 10:30 PM. She feels like it has slowed her heart rate down but it still racing.  PCP DR Rosezetta SchlatterVernado, [facility] Dr Royann Shiversroitoru  Past Medical History  Diagnosis Date  . Anemia   . Diabetes mellitus without complication     diet and oral hypoglycemic control  . Gestational diabetes   . Dysrhythmia 2015    rapid heartbeat, cardioverted twice in last three weeks using Adenosine.  . Irritable bowel syndrome     since high school  . SVT (supraventricular tachycardia) 07-2014    during current pregnancy   Past Surgical History  Procedure Laterality Date  . Mouth surgery  2004   Family History  Problem Relation Age of Onset  . Asthma Brother   . Hypertension Maternal Grandmother   . Drug abuse Maternal Grandfather    History  Substance Use Topics  . Smoking status: Never Smoker   . Smokeless tobacco: Never Used  . Alcohol Use: No   Lives at home Lives with spouse  OB History    Gravida Para Term Preterm AB TAB SAB Ectopic Multiple Living   2 2 1 1  0 0 0 0 0 2     Review of Systems  All other systems reviewed and are  negative.     Allergies  Latex  Home Medications   Prior to Admission medications   Medication Sig Start Date End Date Taking? Authorizing Provider  metoprolol tartrate (LOPRESSOR) 25 MG tablet Take 1 tablet (25 mg total) by mouth daily as needed (For increased heart rate.). 09/09/14  Yes Scot JunKaren E Teague Clark, PA-C   BP 149/109 mmHg  Pulse 133  Temp(Src) 97.9 F (36.6 C) (Oral)  Resp 20  Ht 5\' 4"  (1.626 m)  Wt 205 lb (92.987 kg)  BMI 35.17 kg/m2  SpO2 99%  Vital signs normal except hypertension and tachycardia  Physical Exam  Constitutional: She is oriented to person, place, and time. She appears well-developed and well-nourished.  Non-toxic appearance. She does not appear ill. No distress.  HENT:  Head: Normocephalic and atraumatic.  Right Ear: External ear normal.  Left Ear: External ear normal.  Nose: Nose normal. No mucosal edema or rhinorrhea.  Mouth/Throat: Oropharynx is clear and moist and mucous membranes are normal. No dental abscesses or uvula swelling.  Eyes: Conjunctivae and EOM are normal. Pupils are equal, round, and reactive to light.  Neck: Normal range of motion and full passive range of motion without pain. Neck supple.  Cardiovascular: Regular rhythm and normal heart sounds.  Tachycardia present.  Exam reveals no gallop and no friction rub.  No murmur heard. Pulmonary/Chest: Effort normal and breath sounds normal. No respiratory distress. She has no wheezes. She has no rhonchi. She has no rales. She exhibits no tenderness and no crepitus.  Abdominal: Soft. Normal appearance and bowel sounds are normal. She exhibits no distension. There is no tenderness. There is no rebound and no guarding.  Musculoskeletal: Normal range of motion. She exhibits no edema or tenderness.  Moves all extremities well.   Neurological: She is alert and oriented to person, place, and time. She has normal strength. No cranial nerve deficit.  Skin: Skin is warm, dry and intact. No  rash noted. No erythema. No pallor.  Psychiatric: She has a normal mood and affect. Her speech is normal and behavior is normal. Her mood appears not anxious.  Nursing note and vitals reviewed.   ED Course  Procedures (including critical care time)  Medications  adenosine (ADENOCARD) 6 MG/2ML injection 6 mg (6 mg Intravenous Given 11/06/14 0104)  adenosine (ADENOCARD) 6 MG/2ML injection 12 mg (12 mg Intravenous Given 11/06/14 0149)    Review of her prior testing shows patient had a TSH done in December that was normal.  She was seen by cardiology and per their notes she had been given metoprolol without improvement of her heart rate. Also carotid massage did not improve her heart rate. She was converted with a dental card.  Patient had no response to Adenocard 6 mg rapid IV push. After her Adenocard 12 mg IV rapid push she converted to normal sinus rhythm with heart rate in the 80s.this was around 2:07 AM   3:40 AM patient's heart rate is 84. She has been ambulatory to the bathroom and states she feels fine.  Labs Review Labs Reviewed - No data to display  Imaging Review No results found.   EKG Interpretation   Date/Time:  Sunday November 06 2014 00:05:44 EDT Ventricular Rate:  123 PR Interval:    QRS Duration: 80 QT Interval:  313 QTC Calculation: 448 R Axis:   65 Text Interpretation:  Junctional tachycardia Borderline repolarization  abnormality Since last tracing 09 Sep 2014 Non-specific ST-t changes HEART  RATE has increased Confirmed by Trentin Knappenberger  MD-I, Deavon Podgorski ([id number]) on 11/06/2014  12:34:10 AM      MDM   Patient with history of SVT while pregnant. She's currently delivered her child. Patient had episode tonight that resolved with second dose of adenosine. She had thyroid testing done in December that was normal. We discussed if it happens again to take her metoprolol wait 1 hour and if not improving take a second dose. If still not improving come to the ED. She is to contact her  cardiologist's office tomorrow morning to let them know she came to the ED for another episode of SVT.   Final diagnoses:  SVT (supraventricular tachycardia)    Plan discharge  Devoria Albe, MD, Concha Pyo, MD 11/06/14 [phone]

## 2014-11-06 NOTE — ED Notes (Signed)
Patient A&O x4. Respirations even and unlabored, skin warm and dry. Dr. Lynelle DoctorKnapp made aware of SR 88 after 12mg  Adenosine. Patient denies needs at this time. Husband at bedside. Will continue to monitor.

## 2014-11-21 ENCOUNTER — Telehealth: Payer: Self-pay

## 2014-11-21 NOTE — Telephone Encounter (Signed)
Patient call regarding questions with recent medication prescription.  States she was started on labetalol 100mg  BID, but when she went to pick it up at the pharmacy, the tech expressed concern about her h/o heart palpitations.  Patient admits to elevated bp in the office.  Patient educated on labetalol and encouraged to use as prescribed unless switched by PCP or cardiologist.  Patient verbalized understanding and seemed receptive to education provided.  No other questions or concerns. JE, CNM

## 2014-11-28 ENCOUNTER — Ambulatory Visit (INDEPENDENT_AMBULATORY_CARE_PROVIDER_SITE_OTHER): Payer: 59 | Admitting: Physician Assistant

## 2014-11-28 ENCOUNTER — Encounter: Payer: Self-pay | Admitting: Physician Assistant

## 2014-11-28 VITALS — BP 180/112 | HR 73 | Ht 63.0 in | Wt 196.6 lb

## 2014-11-28 DIAGNOSIS — I471 Supraventricular tachycardia: Secondary | ICD-10-CM

## 2014-11-28 DIAGNOSIS — I10 Essential (primary) hypertension: Secondary | ICD-10-CM | POA: Diagnosis not present

## 2014-11-28 NOTE — Assessment & Plan Note (Addendum)
Patient was seen in the emergency room on November 06 2014 with a recurrent episode of SVT.  She did report taking the 25 mg of Lopressor. However, she did not try any of the vagal maneuvers.  I reemphasized the use of these maneuvers.   On April 25 she was started on labetalol help with blood pressure.   She also appears to have some anxiety related to this. May need referral EP if she continues to have recurrent episodes.

## 2014-11-28 NOTE — Assessment & Plan Note (Signed)
Currently on labetalol. Low pressure in the office is elevated however, this morning was 133/85 and last night at home was 121/72. No changes to current medications.

## 2014-11-28 NOTE — Patient Instructions (Signed)
Your physician recommends that you schedule a follow-up appointment in: one year with Dr. Berry  

## 2014-11-28 NOTE — Progress Notes (Signed)
Patient ID: Amber Davenport, female   DOB: 09/30/1980, 34 y.o.   MRN: [id number]    Date:  11/28/2014   ID:  Amber Kindrederesita Chaudhuri, DOB 09/22/1980, MRN [id number]  PCP:  Geryl RankinsVARNADO, EVELYN, MD  Primary Cardiologist:  Croitoru   Chief Complaint  Patient presents with  . Chest Pain    when worried/ chest feels "achy" and "heavy"  . Edema    in both feet     History of Present Illness: Amber Kindrederesita Genrich is a 34 y.o. female  who presents for follow-up of SVT. She had three symptomatic events during the last trimester of her second pregnancy, and one on 11/06/2014. All episodes responded to IV adenosine promptly, but not to IV metoprolol.  The most recent episode required a second dose of adenosine.   She presents today for posthospital follow-up.  She feels well but is anxious at times.  The patient currently denies nausea, vomiting, fever, chest pain, shortness of breath, orthopnea, dizziness, PND, cough, congestion, abdominal pain, hematochezia, melena, lower extremity edema, claudication.  Wt Readings from Last 3 Encounters:  11/28/14 196 lb 9.6 oz (89.177 kg)  11/05/14 205 lb (92.987 kg)  10/21/14 209 lb 6.4 oz (94.983 kg)     Past Medical History  Diagnosis Date  . Anemia   . Diabetes mellitus without complication     diet and oral hypoglycemic control  . Gestational diabetes   . Dysrhythmia 2015    rapid heartbeat, cardioverted twice in last three weeks using Adenosine.  . Irritable bowel syndrome     since high school  . SVT (supraventricular tachycardia) 07-2014    during current pregnancy    Current Outpatient Prescriptions  Medication Sig Dispense Refill  . labetalol (NORMODYNE) 100 MG tablet Take 1 tablet by mouth 2 (two) times daily.  0  . metoprolol tartrate (LOPRESSOR) 25 MG tablet Take 1 tablet (25 mg total) by mouth daily as needed (For increased heart rate.). 5 tablet 0   No current facility-administered medications for this visit.    Allergies:    Allergies    Allergen Reactions  . Latex Itching    Social History:  The patient  reports that she has never smoked. She has never used smokeless tobacco. She reports that she does not drink alcohol or use illicit drugs.   Family history:   Family History  Problem Relation Age of Onset  . Asthma Brother   . Hypertension Maternal Grandmother   . Drug abuse Maternal Grandfather     ROS:  Please see the history of present illness.  All other systems reviewed and negative.   PHYSICAL EXAM: VS:  BP 180/112 mmHg  Pulse 73  Ht 5\' 3"  (1.6 m)  Wt 196 lb 9.6 oz (89.177 kg)  BMI 34.83 kg/m2  obese, well developed, in no acute distress HEENT: Pupils are equal round react to light accommodation extraocular movements are intact.  Neck: no JVDNo cervical lymphadenopathy. Cardiac: Regular rate and rhythm without murmurs rubs or gallops. Lungs:  clear to auscultation bilaterally, no wheezing, rhonchi or rales Ext: no lower extremity edema.  2+ radial and dorsalis pedis pulses. Skin: warm and dry Neuro:  Grossly normal  EKG:    Rate 73 bpm.Normal sinus rhythm T-wave inversions noted in V3 through the 6  ASSESSMENT AND PLAN:  Problem List Items Addressed This Visit    SVT (supraventricular tachycardia)     Patient was seen in the emergency room on November 06 2014 with a recurrent episode of  SVT.  She did report taking the 25 mg of Lopressor. However, she did not try any of the vagal maneuvers.  I reemphasized the use of these maneuvers.   On April 25 she was started on labetalol help with blood pressure.   She also appears to have some anxiety related to this. May need referral EP if she continues to have recurrent episodes.      Relevant Medications   labetalol (NORMODYNE) 100 MG tablet   Essential hypertension - Primary     Currently on labetalol. Low pressure in the office is elevated however, this morning was 133/85 and last night at home was 121/72. No changes to current medications.      Relevant  Medications   labetalol (NORMODYNE) 100 MG tablet   Other Relevant Orders   EKG 12-Lead

## 2015-10-02 ENCOUNTER — Encounter: Payer: Self-pay | Admitting: Cardiovascular Disease

## 2015-10-02 ENCOUNTER — Ambulatory Visit (INDEPENDENT_AMBULATORY_CARE_PROVIDER_SITE_OTHER): Payer: Managed Care, Other (non HMO) | Admitting: Cardiovascular Disease

## 2015-10-02 VITALS — BP 122/82 | HR 76 | Ht 64.0 in | Wt 178.0 lb

## 2015-10-02 DIAGNOSIS — I471 Supraventricular tachycardia, unspecified: Secondary | ICD-10-CM

## 2015-10-02 DIAGNOSIS — R002 Palpitations: Secondary | ICD-10-CM | POA: Diagnosis not present

## 2015-10-02 DIAGNOSIS — I10 Essential (primary) hypertension: Secondary | ICD-10-CM

## 2015-10-02 MED ORDER — METOPROLOL TARTRATE 25 MG PO TABS: 25.0000 mg | ORAL_TABLET | Freq: Two times a day (BID) | ORAL | Status: DC

## 2015-10-02 NOTE — Progress Notes (Signed)
Patient ID: Amber Davenport, female   DOB: 19-Nov-1980, 35 y.o.   MRN: [id number]    Cardiology Office Note    Date:  10/02/2015   ID:  Amber Davenport, DOB Aug 05, 1980, MRN [id number]  PCP:  Tommy Rainwater, MD  Cardiologist:   Thurmon Fair, MD   Chief Complaint  Patient presents with  . Annual Exam    Occas. palpitations.  PE done by PCP last week with EKG and labs - will call to get faxed over.  Has not taken labetolol for several days does feel she needs it anymore.    History of Present Illness:  Amber Davenport is a 35 y.o. 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 Returning for follow-up. In the last year she has had only a couple of episodes of sustained rapid palpitations. These both resolved with the Valsalva maneuver. One of them happened at night. They both were associated with emotional states has possible precipitants. On one occasion she was having an interview with a new boss. The other occasion occurred when she had a nightmare that she had failed to present to a court date for speeding ticket. In between the episodes of sustained palpitations she has relatively frequent isolated skipped beats that worry her. She is taking labetalol only and is not taking metoprolol, although both were included on her medication list. Recent labs performed on February 22 are normal including a normal TSH and normal electrolytes. Hemoglobin was 12. An electrocardiogram performed on February 22 is completely normal without any signs of preexcitation. She is concerned that she may have something structurally wrong with her heart, "like a hole in my heart".    Past Medical History  Diagnosis Date  . Anemia   . Diabetes mellitus without complication (HCC)     diet and oral hypoglycemic control  . Gestational diabetes   . Dysrhythmia 2015    rapid  heartbeat, cardioverted twice in last three weeks using Adenosine.  . Irritable bowel syndrome     since high school  . SVT (supraventricular tachycardia) (HCC) 07-2014    during current pregnancy    Past Surgical History  Procedure Laterality Date  . Mouth surgery  2004    Outpatient Prescriptions Prior to Visit  Medication Sig Dispense Refill  . labetalol (NORMODYNE) 100 MG tablet Take 1 tablet by mouth 2 (two) times daily.  0  . metoprolol tartrate (LOPRESSOR) 25 MG tablet Take 1 tablet (25 mg total) by mouth daily as needed (For increased heart rate.). 5 tablet 0   No facility-administered medications prior to visit.     Allergies:   Latex   Social History   Social History  . Marital Status: Married    Spouse Name: N/A  . Number of Children: N/A  . Years of Education: N/A   Social History Main Topics  . Smoking status: Never Smoker   . Smokeless tobacco: Never Used  . Alcohol Use: No  . Drug Use: No  . Sexual Activity: Yes    Birth Control/ Protection: None   Other Topics Concern  . None   Social History Narrative     Family History:  The patient's family history includes Asthma in her brother; Drug abuse in her maternal grandfather; Hypertension in her maternal grandmother.   ROS:   Please see the history of present illness.    ROS All other systems reviewed and are negative.  PHYSICAL EXAM:   VS:  BP 122/82 mmHg  Pulse 76  Ht 5\' 4"  (1.626 m)  Wt 80.74 kg (178 lb)  BMI 30.54 kg/m2   GEN: Well nourished, well developed, in no acute distress HEENT: normal Neck: no JVD, carotid bruits, or masses Cardiac: RRR; no murmurs, rubs, or gallops,no edema  Respiratory:  clear to auscultation bilaterally, normal work of breathing GI: soft, nontender, nondistended, + BS MS: no deformity or atrophy Skin: warm and dry, no rash Neuro:  Alert and Oriented x 3, Strength and sensation are intact Psych: euthymic mood, full affect  Wt Readings from Last 3  Encounters:  10/02/15 80.74 kg (178 lb)  11/28/14 89.177 kg (196 lb 9.6 oz)  11/05/14 92.987 kg (205 lb)      Studies/Labs Reviewed:   EKG:  EKG is not ordered today. Normal ECG 09/20/15   ASSESSMENT:    1. Palpitations   2. SVT (supraventricular tachycardia) (HCC)   3. Essential hypertension      PLAN:  In order of problems listed above: 1. Most of her palpitations seem to be related to isolated PACs and/or PVCs. We'll switch from labetalol to metoprolol. Avoid stimulants. We'll check an echocardiogram to make sure she does not have an underlying structural cardiac problem. 2. 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 Reviewed vagal maneuvers for symptoms of sensation, she has successfully applied these in the past. If she truly does have AV node reentry, this is unlikely to be associated with a structural cardiac problem as its cause.  3. Blood pressure is well controlled with beta blocker, will reevaluate after switching from labetalol to metoprolol.     Medication Adjustments/Labs and Tests Ordered: Current medicines are reviewed at length with the patient today.  Concerns regarding medicines are outlined above.  Medication changes, Labs and Tests ordered today are listed in the Patient Instructions below. Patient Instructions  Your physician has requested that you have an echocardiogram. Echocardiography is a painless test that uses sound waves to create images of your heart. It provides your doctor with information about the size and shape of your heart and how well your heart's chambers and valves are working. This procedure takes approximately one hour. There are no restrictions for this procedure.  Your physician has recommended you make the following change in your medication:   STOP LABETOLOL  START METOPROLOL 25 MG TWICE DAILY   Dr. Royann Shiversroitoru recommends that you schedule a  follow-up appointment in: ONE YEAR         SignedThurmon Fair, Jamelle Goldston, MD  10/02/2015 1:29 PM    [facility] [address], [place], [place]  [address] Phone: [phone]; Fax: [phone]

## 2015-10-02 NOTE — Patient Instructions (Signed)
Your physician has requested that you have an echocardiogram. Echocardiography is a painless test that uses sound waves to create images of your heart. It provides your doctor with information about the size and shape of your heart and how well your heart's chambers and valves are working. This procedure takes approximately one hour. There are no restrictions for this procedure.  Your physician has recommended you make the following change in your medication:   STOP LABETOLOL  START METOPROLOL 25 MG TWICE DAILY   Dr. Royann Shiversroitoru recommends that you schedule a follow-up appointment in: ONE YEAR

## 2015-10-17 ENCOUNTER — Ambulatory Visit (HOSPITAL_COMMUNITY): Payer: Managed Care, Other (non HMO) | Attending: Cardiovascular Disease

## 2015-10-17 ENCOUNTER — Other Ambulatory Visit: Payer: Self-pay

## 2015-10-17 DIAGNOSIS — R002 Palpitations: Secondary | ICD-10-CM | POA: Diagnosis present

## 2015-11-07 ENCOUNTER — Other Ambulatory Visit (HOSPITAL_COMMUNITY)
Admission: RE | Admit: 2015-11-07 | Discharge: 2015-11-07 | Disposition: A | Discharge status: Home / Self Care | Payer: Managed Care, Other (non HMO) | Source: Ambulatory Visit | Attending: Obstetrics and Gynecology | Admitting: Obstetrics and Gynecology

## 2015-11-07 ENCOUNTER — Other Ambulatory Visit: Payer: Self-pay | Admitting: Obstetrics and Gynecology

## 2015-11-07 DIAGNOSIS — Z01419 Encounter for gynecological examination (general) (routine) without abnormal findings: Secondary | ICD-10-CM | POA: Diagnosis present

## 2015-11-07 DIAGNOSIS — Z1151 Encounter for screening for human papillomavirus (HPV): Secondary | ICD-10-CM | POA: Insufficient documentation

## 2015-11-08 LAB — CYTOLOGY - PAP

## 2016-10-20 IMAGING — US US FETAL BPP W/O NONSTRESS
1 series · 14 of 21 positions shown · non-contrast
Comparison: none

[Series 1: us fetal bpp w/o nonstress · non-contrast · 21 acquisitions, 14 frames shown]
[im 1/21]
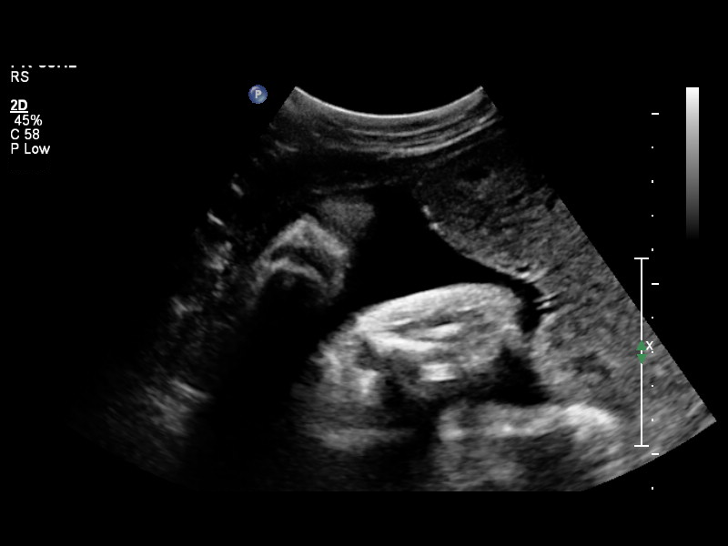
[im 3/21]
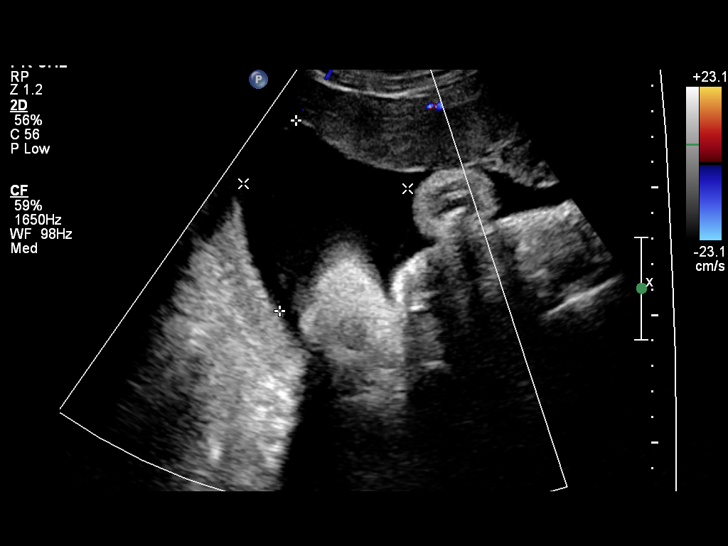
[im 4/21]
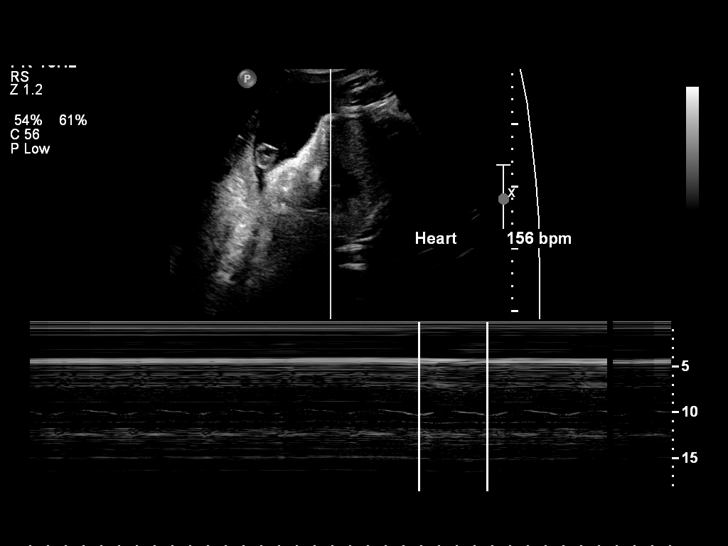
[im 6/21]
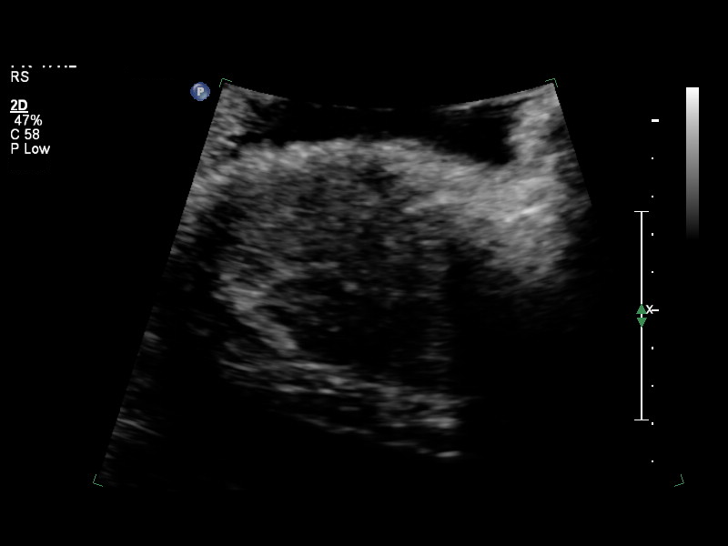
[im 7/21]
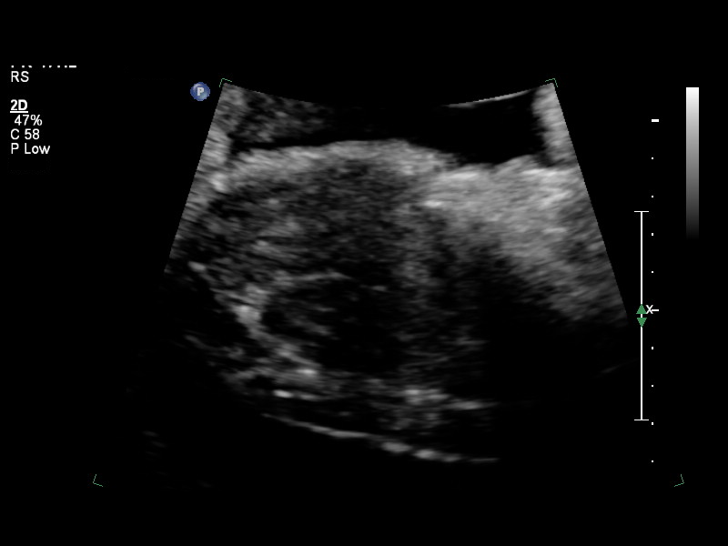
[im 9/21]
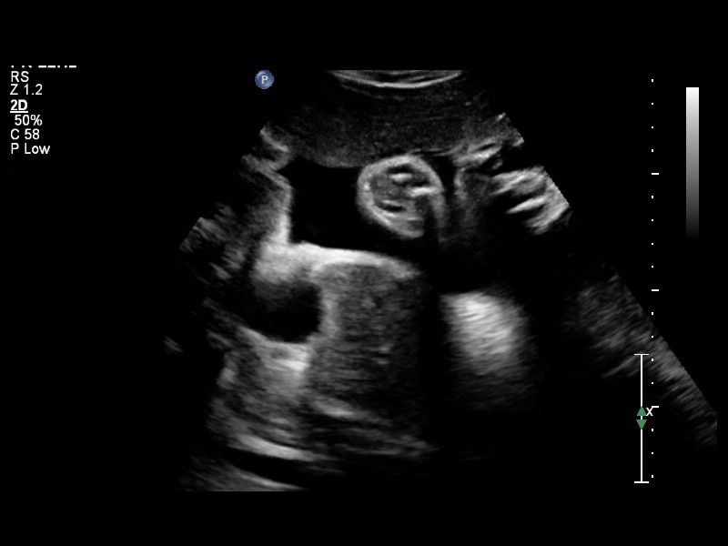
[im 10/21]
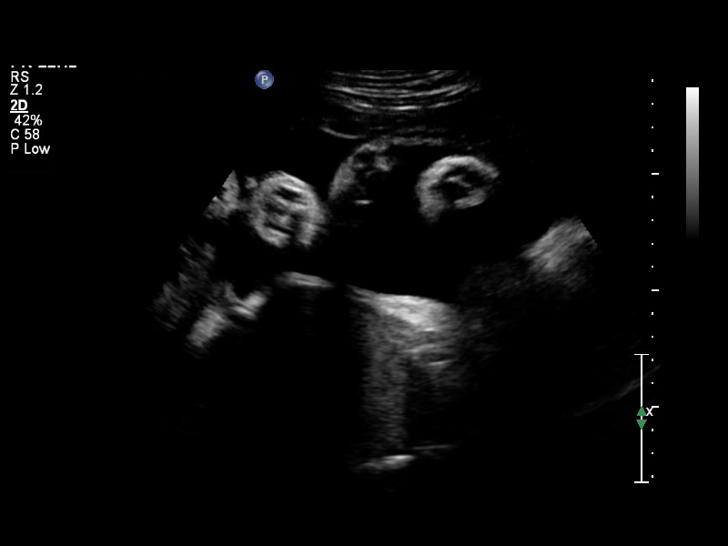
[im 12/21]
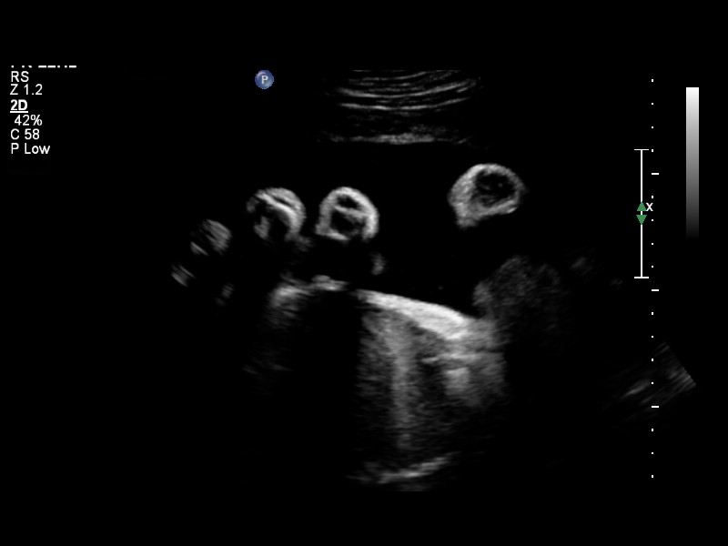
[im 13/21]
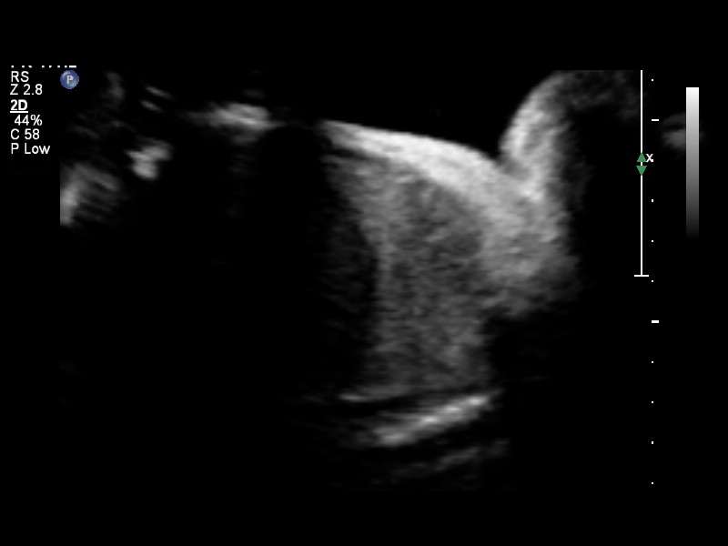
[im 15/21]
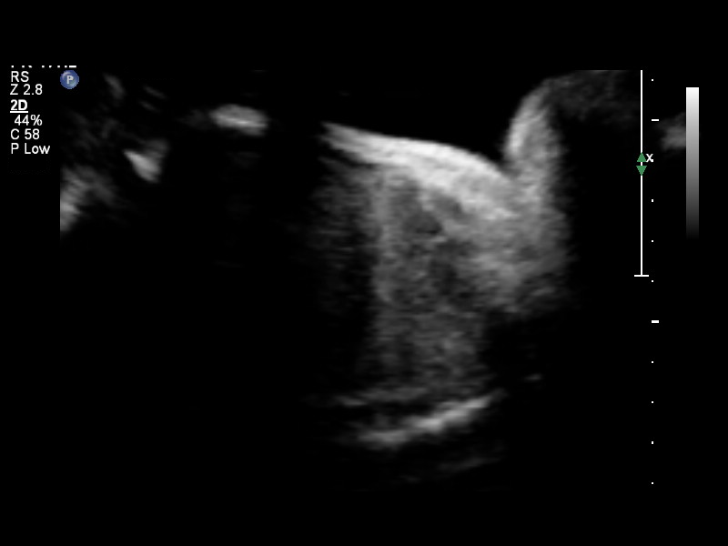
[im 16/21]
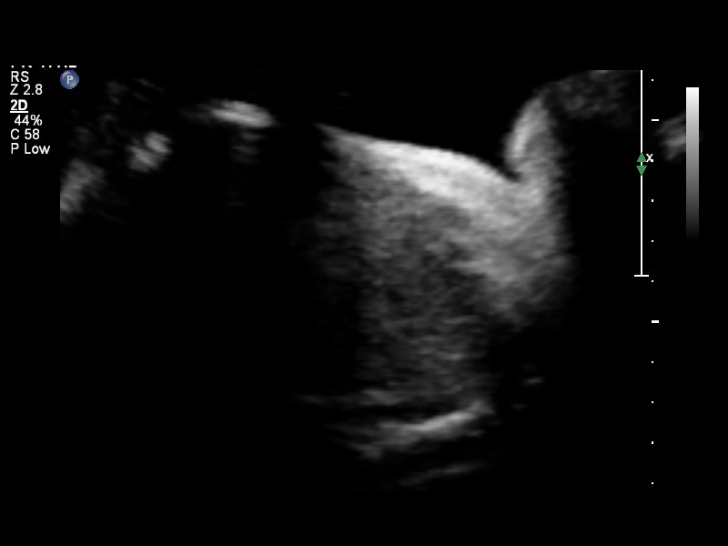
[im 18/21]
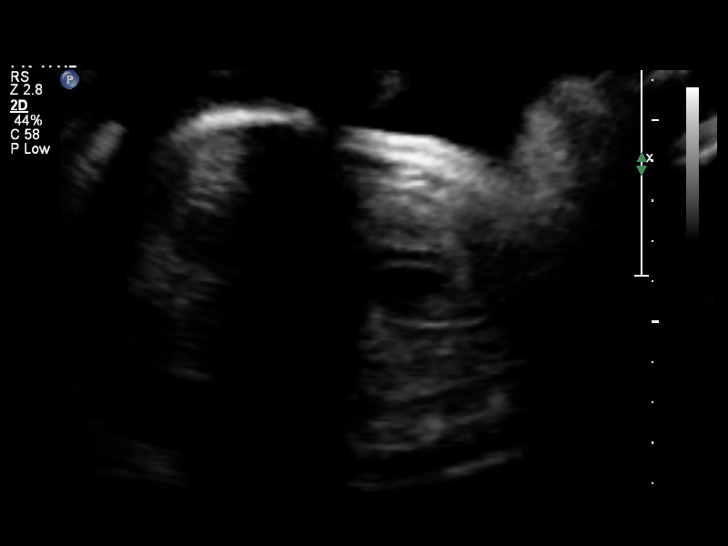
[im 19/21]
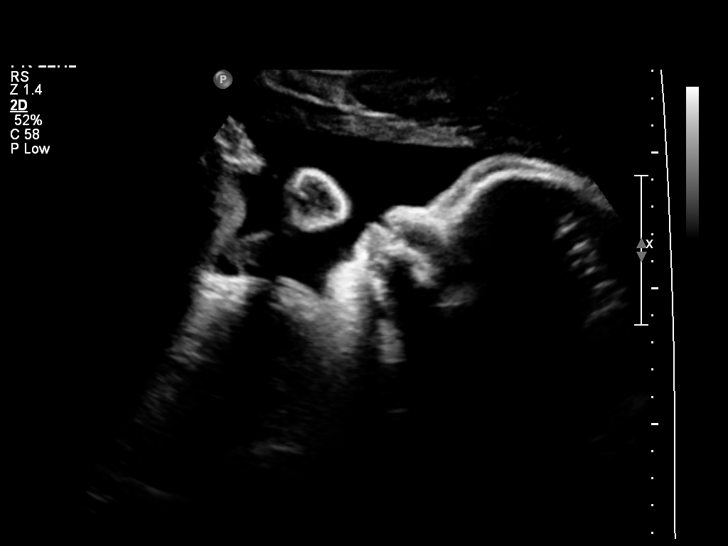
[im 21/21]
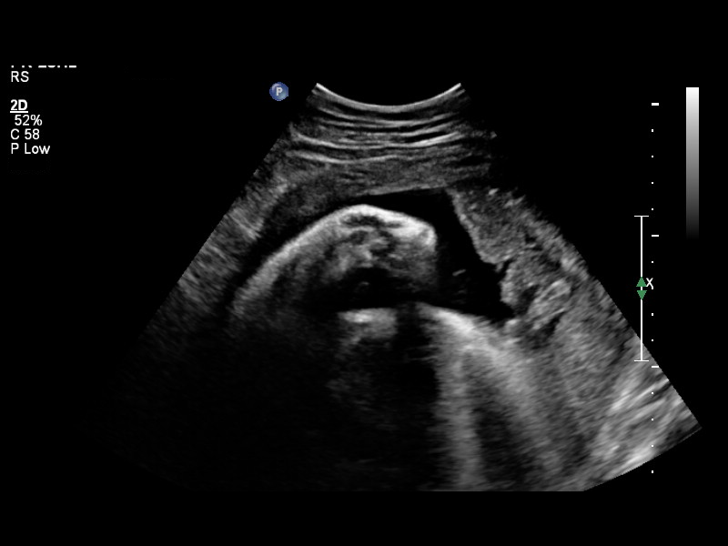

[14 of 21 positions shown; findings below may reference images not displayed]

OBSTETRICS REPORT
                      (Signed Final 08/26/2014 [DATE])

Service(s) Provided

Indications

 Non-reactive NST
 35 weeks gestation of pregnancy
Fetal Evaluation

 Num Of Fetuses:    1
 Fetal Heart Rate:  156                          bpm
 Cardiac Activity:  Observed
 Presentation:      Cephalic

 Comment:    [DATE] BPP in 14 mins

 Amniotic Fluid
 AFI FV:      Subjectively within normal limits
                                             Larg Pckt:     7.5  cm
Biophysical Evaluation

 Amniotic F.V:   Within normal limits       F. Tone:        Observed
 F. Movement:    Observed                   Score:          [DATE]
 F. Breathing:   Observed
Gestational Age

 Clinical EDD:  35w 6d                                        EDD:   09/24/14
 Best:          35w 6d     Det. By:  Clinical EDD             EDD:   09/24/14
Impression

 Single IUP at 35w 6d
 BPP [DATE]
 Normal amniotic fluid volume
Recommendations

 Follow-up ultrasounds as clinically indicated.

## 2019-01-20 ENCOUNTER — Other Ambulatory Visit: Payer: Self-pay | Admitting: *Deleted

## 2019-01-20 DIAGNOSIS — Z20822 Contact with and (suspected) exposure to covid-19: Secondary | ICD-10-CM

## 2019-01-20 NOTE — Progress Notes (Signed)
lab

## 2019-01-23 LAB — NOVEL CORONAVIRUS, NAA: SARS-CoV-2, NAA: NOT DETECTED

## 2019-03-03 ENCOUNTER — Other Ambulatory Visit: Payer: Self-pay

## 2019-03-03 DIAGNOSIS — Z20822 Contact with and (suspected) exposure to covid-19: Secondary | ICD-10-CM

## 2019-03-04 LAB — NOVEL CORONAVIRUS, NAA: SARS-CoV-2, NAA: NOT DETECTED

## 2019-06-02 ENCOUNTER — Other Ambulatory Visit: Payer: Self-pay

## 2019-06-02 DIAGNOSIS — Z20822 Contact with and (suspected) exposure to covid-19: Secondary | ICD-10-CM

## 2019-06-03 LAB — NOVEL CORONAVIRUS, NAA: SARS-CoV-2, NAA: NOT DETECTED

## 2019-08-02 ENCOUNTER — Ambulatory Visit: Payer: Self-pay | Admitting: *Deleted

## 2019-08-02 NOTE — Telephone Encounter (Signed)
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 Please advise           Returned call to patient regarding repreat testing after she tested negative and her daughter teste positive. Tested positive on Dec 27 th. Her husband, son and herself all tested negative. She is advised to not get retested. And if she has to, to retest, 14 days after the original testing.  She is not having symptoms. Advised to contact her provider and speak to her regarding retesting.  She voiced understanding.  Reason for Disposition . General information question, no triage required and triager able to answer question  Answer Assessment - Initial Assessment Questions 1. REASON FOR CALL or QUESTION: "What is your reason for calling today?" or "How can I best help you?" or "What question do you have that I can help answer?"     See note  Protocols used: INFORMATION ONLY CALL - NO TRIAGE-A-AH

## 2019-08-03 ENCOUNTER — Ambulatory Visit: Payer: Managed Care, Other (non HMO) | Attending: Internal Medicine

## 2019-08-03 DIAGNOSIS — Z20822 Contact with and (suspected) exposure to covid-19: Secondary | ICD-10-CM

## 2019-08-05 LAB — NOVEL CORONAVIRUS, NAA: SARS-CoV-2, NAA: NOT DETECTED

## 2020-07-27 ENCOUNTER — Ambulatory Visit: Payer: Self-pay | Attending: Internal Medicine

## 2020-07-27 DIAGNOSIS — Z23 Encounter for immunization: Secondary | ICD-10-CM

## 2020-07-27 NOTE — Progress Notes (Signed)
   Covid-19 Vaccination Clinic  Name:  Amber Davenport    MRN: [id number] DOB: Apr 15, 1981  07/27/2020  [name] was observed post Covid-19 immunization for 15 minutes without incident. She was provided with Vaccine Information Sheet and instruction to access the V-Safe system.   [name] was instructed to call [phone] with any severe reactions post vaccine: Marland Kitchen Difficulty breathing  . Swelling of face and throat  . A fast heartbeat  . A bad rash all over body  . Dizziness and weakness   Immunizations Administered    Name Date Dose VIS Date Route   Moderna Covid-19 Booster Vaccine 07/27/2020  3:28 PM 0.25 mL 05/17/2020 Intramuscular   Manufacturer: Moderna   Lot: [id number]   NDC: 65681-275-17

## 2020-09-26 ENCOUNTER — Other Ambulatory Visit (HOSPITAL_COMMUNITY): Payer: Self-pay | Admitting: Family Medicine

## 2020-09-26 DIAGNOSIS — R609 Edema, unspecified: Secondary | ICD-10-CM

## 2020-09-27 ENCOUNTER — Ambulatory Visit (HOSPITAL_COMMUNITY): Admission: RE | Admit: 2020-09-27 | Payer: Self-pay | Source: Ambulatory Visit

## 2020-12-21 ENCOUNTER — Ambulatory Visit (INDEPENDENT_AMBULATORY_CARE_PROVIDER_SITE_OTHER): Payer: PRIVATE HEALTH INSURANCE | Admitting: Podiatry

## 2020-12-21 ENCOUNTER — Encounter: Payer: Self-pay | Admitting: Podiatry

## 2020-12-21 ENCOUNTER — Ambulatory Visit (INDEPENDENT_AMBULATORY_CARE_PROVIDER_SITE_OTHER): Payer: PRIVATE HEALTH INSURANCE

## 2020-12-21 ENCOUNTER — Other Ambulatory Visit: Payer: Self-pay

## 2020-12-21 DIAGNOSIS — M2012 Hallux valgus (acquired), left foot: Secondary | ICD-10-CM

## 2020-12-21 DIAGNOSIS — N926 Irregular menstruation, unspecified: Secondary | ICD-10-CM | POA: Insufficient documentation

## 2020-12-21 DIAGNOSIS — M205X2 Other deformities of toe(s) (acquired), left foot: Secondary | ICD-10-CM | POA: Diagnosis not present

## 2020-12-21 DIAGNOSIS — R002 Palpitations: Secondary | ICD-10-CM | POA: Insufficient documentation

## 2020-12-21 DIAGNOSIS — G5620 Lesion of ulnar nerve, unspecified upper limb: Secondary | ICD-10-CM | POA: Insufficient documentation

## 2020-12-21 DIAGNOSIS — F419 Anxiety disorder, unspecified: Secondary | ICD-10-CM | POA: Insufficient documentation

## 2020-12-21 DIAGNOSIS — F411 Generalized anxiety disorder: Secondary | ICD-10-CM | POA: Insufficient documentation

## 2020-12-21 DIAGNOSIS — Z30432 Encounter for removal of intrauterine contraceptive device: Secondary | ICD-10-CM | POA: Insufficient documentation

## 2020-12-21 DIAGNOSIS — Z8632 Personal history of gestational diabetes: Secondary | ICD-10-CM | POA: Insufficient documentation

## 2020-12-22 ENCOUNTER — Other Ambulatory Visit: Payer: Self-pay | Admitting: Podiatry

## 2020-12-22 DIAGNOSIS — M205X2 Other deformities of toe(s) (acquired), left foot: Secondary | ICD-10-CM

## 2020-12-24 NOTE — Progress Notes (Signed)
Subjective:  Patient ID: Amber Davenport, female    DOB: 1981/05/12,  MRN: [id number] HPI Chief Complaint  Patient presents with  . Foot Pain    1st MPJ left - aching, burning x years, worsening, trouble with dress shoes, limited ROM  . New Patient (Initial Visit)    40 y.o. female presents with the above complaint.   ROS: Denies fever chills nausea vomiting muscle aches pains calf pain back pain chest pain shortness of breath.  Past Medical History:  Diagnosis Date  . Anemia   . Diabetes mellitus without complication (HCC)    diet and oral hypoglycemic control  . Dysrhythmia 2015   rapid heartbeat, cardioverted twice in last three weeks using Adenosine.  . Gestational diabetes   . Irritable bowel syndrome    since high school  . SVT (supraventricular tachycardia) (HCC) 07-2014   during current pregnancy   Past Surgical History:  Procedure Laterality Date  . MOUTH SURGERY  2004   No current outpatient medications on file.  Allergies  Allergen Reactions  . Latex Itching   Review of Systems Objective:  There were no vitals filed for this visit.  General: Well developed, nourished, in no acute distress, alert and oriented x3   Dermatological: Skin is warm, dry and supple bilateral. Nails x 10 are well maintained; remaining integument appears unremarkable at this time. There are no open sores, no preulcerative lesions, no rash or signs of infection present.  Vascular: Dorsalis Pedis artery and Posterior Tibial artery pedal pulses are 2/4 bilateral with immedate capillary fill time. Pedal hair growth present. No varicosities and no lower extremity edema present bilateral.   Neruologic: Grossly intact via light touch bilateral. Vibratory intact via tuning fork bilateral. Protective threshold with Semmes Wienstein monofilament intact to all pedal sites bilateral. Patellar and Achilles deep tendon reflexes 2+ bilateral. No Babinski or clonus noted bilateral.    Musculoskeletal: No gross boney pedal deformities bilateral. No pain, crepitus, or limitation noted with foot and ankle range of motion bilateral. Muscular strength 5/5 in all groups tested bilateral.  Severe limitation in range of motion of the first metatarsophalangeal joint of the left foot as opposed to that of the right foot.  Flexible pes planus is noted bilaterally.  There is no crepitation on range of motion of either foot to the joints distal to the ankle.  There is mild erythema without cellulitis drainage or odor around the first metatarsophalangeal joint of the left foot.  Moderately tender on palpation.   Gait: Unassisted, Nonantalgic.    Radiographs:  Radiographs taken bilaterally today demonstrate an osseously mature individual with marked pes planus bilateral and elevated first metatarsal on the left foot with dorsal spurring joint space narrowing subchondral sclerosis and eburnation is noted.  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  Right foot flexible pes planovalgus without joint involvement.  No coalitions are identified bilaterally.  Plan: Discussed etiology pathology conservative surgical therapies at this point time went ahead and performed a surgical consult with the consent form signing for a total Keller arthroplasty with a single silicone implant.  We discussed in great detail today she understands the possible postop complications which may include but not limited to postop pain bleeding swelling infection recurrence need for further surgery overcorrection under correction loss of digit loss of limb loss of life failure of the implant.  We did discuss other options such as fusion or condylectomy's she declined both of  these.    Modena Bellemare T. [place], [place]

## 2021-02-01 ENCOUNTER — Telehealth: Payer: Self-pay | Admitting: Urology

## 2021-02-01 NOTE — Telephone Encounter (Signed)
DOS - 02/23/21  KELLER BUNION IMPLANT LEFT --- [id number]   [employer] EFFECTIVE DATE - 10/16/20   SPOKE WITH TERESA AND SHE STATED THAT FOR CPT CODE 68127 NO PRIOR AUTH IS REQUIRED.  REF # Rosey Bath S 02/01/21

## 2021-02-20 ENCOUNTER — Encounter: Payer: Self-pay | Admitting: Podiatry

## 2021-02-21 ENCOUNTER — Other Ambulatory Visit: Payer: Self-pay | Admitting: Podiatry

## 2021-02-21 MED ORDER — OXYCODONE-ACETAMINOPHEN 10-325 MG PO TABS: 1.0000 | ORAL_TABLET | Freq: Three times a day (TID) | ORAL | 0 refills | Status: AC | PRN

## 2021-02-21 MED ORDER — CEPHALEXIN 500 MG PO CAPS: 500.0000 mg | ORAL_CAPSULE | Freq: Three times a day (TID) | ORAL | 0 refills | Status: DC

## 2021-02-21 MED ORDER — ONDANSETRON HCL 4 MG PO TABS: 4.0000 mg | ORAL_TABLET | Freq: Three times a day (TID) | ORAL | 0 refills | Status: DC | PRN

## 2021-02-23 DIAGNOSIS — M205X2 Other deformities of toe(s) (acquired), left foot: Secondary | ICD-10-CM

## 2021-02-28 ENCOUNTER — Ambulatory Visit
Admission: RE | Admit: 2021-02-28 | Discharge: 2021-02-28 | Disposition: A | Discharge status: Home / Self Care | Payer: Self-pay | Source: Ambulatory Visit | Attending: Internal Medicine | Admitting: Internal Medicine

## 2021-02-28 ENCOUNTER — Other Ambulatory Visit: Payer: Self-pay | Admitting: Internal Medicine

## 2021-02-28 DIAGNOSIS — M79605 Pain in left leg: Secondary | ICD-10-CM

## 2021-02-28 DIAGNOSIS — M7989 Other specified soft tissue disorders: Secondary | ICD-10-CM

## 2021-02-28 DIAGNOSIS — R7989 Other specified abnormal findings of blood chemistry: Secondary | ICD-10-CM

## 2021-02-28 DIAGNOSIS — R0789 Other chest pain: Secondary | ICD-10-CM

## 2021-02-28 MED ORDER — IOPAMIDOL (ISOVUE-370) INJECTION 76%
75.0000 mL | Freq: Once | INTRAVENOUS | Status: AC | PRN
  Administered 2021-02-28: 75 mL via INTRAVENOUS

## 2021-03-01 ENCOUNTER — Other Ambulatory Visit: Payer: Self-pay

## 2021-03-01 ENCOUNTER — Ambulatory Visit (INDEPENDENT_AMBULATORY_CARE_PROVIDER_SITE_OTHER): Payer: No Typology Code available for payment source | Admitting: Podiatry

## 2021-03-01 ENCOUNTER — Ambulatory Visit (INDEPENDENT_AMBULATORY_CARE_PROVIDER_SITE_OTHER): Payer: No Typology Code available for payment source

## 2021-03-01 ENCOUNTER — Encounter: Payer: Self-pay | Admitting: Podiatry

## 2021-03-01 ENCOUNTER — Ambulatory Visit
Admission: RE | Admit: 2021-03-01 | Discharge: 2021-03-01 | Disposition: A | Discharge status: Home / Self Care | Payer: No Typology Code available for payment source | Source: Ambulatory Visit | Attending: Internal Medicine | Admitting: Internal Medicine

## 2021-03-01 DIAGNOSIS — Z9889 Other specified postprocedural states: Secondary | ICD-10-CM

## 2021-03-01 DIAGNOSIS — M205X2 Other deformities of toe(s) (acquired), left foot: Secondary | ICD-10-CM

## 2021-03-01 DIAGNOSIS — M79605 Pain in left leg: Secondary | ICD-10-CM

## 2021-03-04 NOTE — Progress Notes (Signed)
She presents today for her first postop visit date of surgery 7 29 2022.  She is status post Keller arthroplasty with single silicone implant left.  States that is been quite sore and she presents today with crutches.  She goes on to say that she has been at up on her foot quite a bit.  She denies getting wet denies fever chills nausea vomiting muscle aches pains calf pain back pain chest pain shortness of breath.  Objective: Vital signs are stable alert oriented x3.  Dry sterile dressing intact once removed demonstrates moderate edema no erythema cellulitis drainage or odor but she does have swelling and tenderness overlying the surgical site.  She has good range of motion of the joint however.  Radiographs were not taken today secondary to mechanical failure.  Assessment: Well-healing surgical foot.  Plan: We really encouraged her to stay off of this foot encouraged her to perform range of motion exercises as well.  Her to keep his foot elevated the majority of the time.  She understands this and is amendable to it she will keep it clean and dry.  Redressed it today sterile compressive dressing and I will follow-up with her in 1 week I doubt we will be able to remove sutures next week if his if it is as swollen as it is today.

## 2021-03-07 NOTE — Progress Notes (Deleted)
Date:  03/07/2021   ID:  Amber Davenport, DOB October 16, 1980, MRN [id number]  PCP:  Amber Ishihara, MD  Cardiologist:  Amber Lerner, DO, [facility]  (established care 03/08/2021) Former Cardiology Providers: ***  REASON FOR CONSULT: Palpitations  REQUESTING PHYSICIAN:  Amber Ishihara, MD [address] [place],  [place] [address]  No chief complaint on file.   HPI  Amber Davenport is a 40 y.o. female who presents to the office with a chief complaint of "***." Patient's past medical history and cardiovascular risk factors include: hypertension, supraventricular tachycardia, ***  She is referred to the office at the request of Amber Davenport,* for evaluation of palpitations.  ***  History of  Denies prior history of coronary artery disease, myocardial infarction, congestive heart failure, deep venous thrombosis, pulmonary embolism, stroke, transient ischemic attack.  FUNCTIONAL STATUS: ***   ALLERGIES: Allergies  Allergen Reactions   Latex Itching    MEDICATION LIST PRIOR TO VISIT: No outpatient medications have been marked as taking for the 03/08/21 encounter (Appointment) with Amber Lerner, DO.     PAST MEDICAL HISTORY: Past Medical History:  Diagnosis Date   Anemia    Diabetes mellitus without complication (HCC)    diet and oral hypoglycemic control   Dysrhythmia 2015   rapid heartbeat, cardioverted twice in last three weeks using Adenosine.   Gestational diabetes    Irritable bowel syndrome    since high school   SVT (supraventricular tachycardia) (HCC) 07-2014   during current pregnancy    PAST SURGICAL HISTORY: Past Surgical History:  Procedure Laterality Date   MOUTH SURGERY  2004    FAMILY HISTORY: The patient family history includes Asthma in her brother; Drug abuse in her maternal grandfather; Hypertension in her maternal grandmother.  SOCIAL HISTORY:  The patient  reports that she has never smoked. She has never used  smokeless tobacco. She reports that she does not drink alcohol and does not use drugs.  REVIEW OF SYSTEMS: ROS  PHYSICAL EXAM: Vitals with BMI 10/02/2015 11/28/2014 11/28/2014  Height 5\' 4"  - 5\' 3"   Weight 178 lbs - 196 lbs 10 oz  BMI 30.6 - 34.9  Systolic 122 180  Diastolic 82 112 110  Pulse 76 - 73    CONSTITUTIONAL: Well-developed and well-nourished. No acute distress.  SKIN: Skin is warm and dry. No rash noted. No cyanosis. No pallor. No jaundice HEAD: Normocephalic and atraumatic.  EYES: No scleral icterus MOUTH/THROAT: Moist oral membranes.  NECK: No JVD present. No thyromegaly noted. No carotid bruits  LYMPHATIC: No visible cervical adenopathy.  CHEST Normal respiratory effort. No intercostal retractions  LUNGS: *** No stridor. No wheezes. No rales.  CARDIOVASCULAR: *** ABDOMINAL: No apparent ascites.  EXTREMITIES: No peripheral edema  HEMATOLOGIC: No significant bruising NEUROLOGIC: Oriented to person, place, and time. Nonfocal. Normal muscle tone.  PSYCHIATRIC: Normal mood and affect. Normal behavior. Cooperative  CARDIAC DATABASE: EKG: ***  Echocardiogram: No results found for this or any previous visit from the past 1095 days.    Stress Testing: No results found for this or any previous visit from the past 1095 days.   Heart Catheterization: ***  LABORATORY DATA: CBC Latest Ref Rng & Units 09/12/2014 09/11/2014 09/11/2014  WBC 4.0 - 10.5 K/uL 11.0(H) 12.6(H) 11.5(H)  Hemoglobin 12.0 - 15.0 g/dL 10.2(L) 11.2(L) 12.3  Hematocrit 36.0 - 46.0 % 30.8(L) 33.6(L) 36.5  Platelets 150 - 400 K/uL 143(L) 149(L) 161    CMP Latest Ref Rng & Units 09/11/2014 08/10/2014 07/25/2014  Glucose 70 - 99 mg/dL 82 379(K) 240(X)  BUN 6 - 23 mg/dL 6 6 8   Creatinine 0.50 - 1.10 mg/dL 7.35 3.29  Sodium 135 - 145 mmol/L 137 135 136  Potassium 3.5 - 5.1 mmol/L 3.8 3.8 3.8  Chloride 96 - 112 mmol/L 110 108 104  CO2 19 - 32 mmol/L 23 21 22   Calcium 8.4 - 10.5 mg/dL 9.2 8.5 9.4   Total Protein 6.0 - 8.3 g/dL 9.24) - 6.5  Total Bilirubin 0.3 - 1.2 mg/dL 0.4 - )  Alkaline Phos 39 - 117 U/L 84 - 56  AST 0 - 37 U/L 37 - 26  ALT 0 - 35 U/L 30 - 18    Lipid Panel  No results found for: CHOL, TRIG, HDL, CHOLHDL, VLDL, LDLCALC, LDLDIRECT, LABVLDL  No components found for: NTPROBNP No results for input(s): PROBNP in the last 8760 hours. No results for input(s): TSH in the last 8760 hours.  BMP No results for input(s): NA, K, CL, CO2, GLUCOSE, BUN, CREATININE, CALCIUM, GFRNONAA, GFRAA in the last 8760 hours.  HEMOGLOBIN A1C No results found for: HGBA1C, MPG  IMPRESSION:  No diagnosis found.   RECOMMENDATIONS: Amber Davenport is a 40 y.o. female whose past medical history and cardiac risk factors include: ***   FINAL MEDICATION LIST END OF ENCOUNTER: No orders of the defined types were placed in this encounter.   There are no discontinued medications.   Current Outpatient Medications:    cephALEXin (KEFLEX) 500 MG capsule, Take 1 capsule (500 mg total) by mouth 3 (three) times daily., Disp: 30 capsule, Rfl: 0   ondansetron (ZOFRAN) 4 MG tablet, Take 1 tablet (4 mg total) by mouth every 8 (eight) hours as needed., Disp: 20 tablet, Rfl: 0  No orders of the defined types were placed in this encounter.   There are no Patient Instructions on file for this visit.   --Continue cardiac medications as reconciled in final medication list. --No follow-ups on file. Or sooner if needed. --Continue follow-up with your primary care physician regarding the management of your other chronic comorbid conditions.  Patient's questions and concerns were addressed to her satisfaction. She voices understanding of the instructions provided during this encounter.   This note was created using a voice recognition software as a result there may be grammatical errors inadvertently enclosed that do not reflect the nature of this encounter. Every attempt is made to correct  such errors.  Amber Davenport, 20, [facility]  Pager: [phone] Office: [phone]

## 2021-03-08 ENCOUNTER — Ambulatory Visit: Payer: Self-pay | Admitting: Cardiology

## 2021-03-08 ENCOUNTER — Other Ambulatory Visit: Payer: Self-pay

## 2021-03-08 ENCOUNTER — Ambulatory Visit (INDEPENDENT_AMBULATORY_CARE_PROVIDER_SITE_OTHER): Payer: No Typology Code available for payment source | Admitting: Podiatry

## 2021-03-08 DIAGNOSIS — M205X2 Other deformities of toe(s) (acquired), left foot: Secondary | ICD-10-CM | POA: Diagnosis not present

## 2021-03-08 DIAGNOSIS — Z9889 Other specified postprocedural states: Secondary | ICD-10-CM

## 2021-03-09 NOTE — Progress Notes (Signed)
She presents today with her husband for her second postop visit date of surgery 02/23/2021 status post Lorenz Coaster bunion repair left foot.  Denies fever chills nausea vomiting muscle aches pains calf pain back pain chest pain shortness of breath.  States that she has been keeping it up more and trying to move her toes more.  Objective: Dressed her dressing was removed demonstrates decrease in edema no erythema cellulitis drainage or odor sutures are intact margins remain well coapted.  Assessment: Well-healing surgical foot.  Plan: Redressed today dressed a compressive dressing placed her in a Darco shoe we will follow-up with her in 2 weeks

## 2021-03-19 NOTE — Progress Notes (Signed)
Date:  03/20/2021   ID:  Amber Davenport, DOB [birth year], MRN [id number]  PCP:  Lorenda Ishihara, MD  Cardiologist:  Tessa Lerner, DO, [facility] (established care 03/20/2021) Former Cardiology Providers: Thurmon Fair, MD  REASON FOR CONSULT: Palpitations  REQUESTING PHYSICIAN:  Lorenda Ishihara, MD [address] [place],  [place] [address]  Chief Complaint  Patient presents with   Palpitations   New Patient (Initial Visit)    HPI  Amber Davenport is a 40 y.o. female who presents to the office with a chief complaint of " palpitations." Patient's past medical history and cardiovascular risk factors include: hypertension, supraventricular tachycardia, history of COVID-19 infection (May 2022), gestational diabetes, postpartum depression, anxiety, panic attacks.  She is referred to the office at the request of Lorenda Ishihara,* for evaluation of palpitations.  She is accompanied by her husband Amber Davenport at today's visit.  Patient states that she recently underwent a left big toe surgery and thereafter was prescribed oxycodone for pain control.  Once he started taking her pain medications he started experiencing generalized itching which led to further anxiety.  Patient states that whenever she gets anxious either due to stressful situations or being nervous she starts noticing palpitations and elevated heart rates.  The symptoms usually improve with calming down, deep breathing exercises.  She recently saw her PCP and was started on sertraline for anxiety the patient has not started the medication yet.  She now is here for cardiovascular evaluation.  Symptoms of palpitation have been present for the last 1 month, lasting for a few seconds, improves with calming down and breathing exercises, no near-syncope or syncopal event.  No new medications or over-the-counter medications, no stimulants or weight loss supplements.  No known thyroid disease or anemia per  patient.  Patient states that her home blood pressures are better controlled with SBP between 126-130 mmHg.  Patient states that the symptoms are significantly less in intensity and frequency as opposed to when she was noted to have supraventricular tachycardia during her pregnancy in the past.  FUNCTIONAL STATUS: Works out a day - cross fit.     ALLERGIES: Allergies  Allergen Reactions   Oxycodone Itching   Latex Itching    MEDICATION LIST PRIOR TO VISIT: Current Meds  Medication Sig   sertraline (ZOLOFT) 25 MG tablet Take 25 mg by mouth daily.     PAST MEDICAL HISTORY: Past Medical History:  Diagnosis Date   Anemia    Dysrhythmia 07/29/2013   rapid heartbeat, cardioverted twice in last three weeks using Adenosine.   Gestational diabetes    Irritable bowel syndrome    since high school   SVT (supraventricular tachycardia) (HCC) 07/29/2014   during current pregnancy    PAST SURGICAL HISTORY: Past Surgical History:  Procedure Laterality Date   FOOT SURGERY Left    MOUTH SURGERY  07/29/2002    FAMILY HISTORY: The patient family history includes Asthma in her brother; Diabetes in her brother and mother; Drug abuse in her maternal grandfather; Hypertension in her brother, maternal grandmother, and mother.  SOCIAL HISTORY:  The patient  reports that she has never smoked. She has never used smokeless tobacco. She reports that she does not drink alcohol and does not use drugs.  REVIEW OF SYSTEMS: Review of Systems  Constitutional: Negative for chills and fever.  HENT:  Negative for hoarse voice and nosebleeds.   Eyes:  Negative for discharge, double vision and pain.  Cardiovascular:  Positive for palpitations. Negative for chest  pain, claudication, dyspnea on exertion, leg swelling, near-syncope, orthopnea, paroxysmal nocturnal dyspnea and syncope.  Respiratory:  Negative for hemoptysis and shortness of breath.   Musculoskeletal:  Negative for muscle cramps and  myalgias.  Gastrointestinal:  Negative for abdominal pain, constipation, diarrhea, hematemesis, hematochezia, melena, nausea and vomiting.  Neurological:  Negative for dizziness and light-headedness.  Psychiatric/Behavioral:  The patient is nervous/anxious.    PHYSICAL EXAM: Vitals with BMI 03/20/2021 03/20/2021 10/02/2015  Height - 5\' 4"  5\' 4"   Weight - 177 lbs 178 lbs  BMI - 30.37 30.6  Systolic 150 131  Diastolic 88 91 82  Pulse 107 111 76    CONSTITUTIONAL: Well-developed and well-nourished. No acute distress.  SKIN: Skin is warm and dry. No rash noted. No cyanosis. No pallor. No jaundice HEAD: Normocephalic and atraumatic.  EYES: No scleral icterus MOUTH/THROAT: Moist oral membranes.  NECK: No JVD present. No thyromegaly noted. No carotid bruits  LYMPHATIC: No visible cervical adenopathy.  CHEST Normal respiratory effort. No intercostal retractions  LUNGS: Clear to auscultation bilaterally.  No stridor. No wheezes. No rales.  CARDIOVASCULAR: Regular rate and rhythm, positive S1-S2, no murmurs rubs or gallops appreciated ABDOMINAL: Soft, nontender, nondistended, positive bowel sounds in all 4 quadrants, no apparent ascites.  EXTREMITIES: No peripheral edema, boot present over the left foot, 2+ PT pulses. HEMATOLOGIC: No significant bruising NEUROLOGIC: Oriented to person, place, and time. Nonfocal. Normal muscle tone.  PSYCHIATRIC: Normal mood and affect. Normal behavior. Cooperative  CARDIAC DATABASE: EKG: 03/20/2021: Sinus tachycardia, 101 bpm, short PR interval, nonspecific T wave abnormality, without underlying injury pattern  Echocardiogram: 10/17/2015: LVEF 60 to 65%, normal wall motion, no regional wall motion abnormalities no significant valvular heart disease.   Stress Testing: No results found for this or any previous visit from the past 1095 days.  Heart Catheterization: None  RADIOLOGY: CT PE protocol.  02/28/2021: Normal CTA of the chest.  No evidence of  pulmonary embolism or other acute findings.  LABORATORY DATA: CBC Latest Ref Rng & Units 09/12/2014 09/11/2014 09/11/2014  WBC 4.0 - 10.5 K/uL 11.0(H) 12.6(H) 11.5(H)  Hemoglobin 12.0 - 15.0 g/dL 10.2(L) 11.2(L) 12.3  Hematocrit 36.0 - 46.0 % 30.8(L) 33.6(L) 36.5  Platelets 150 - 400 K/uL 143(L) 149(L) 161    CMP Latest Ref Rng & Units 09/11/2014 08/10/2014 07/25/2014  Glucose 70 - 99 mg/dL 82 08/12/2014) 07/27/2014)  BUN 6 - 23 mg/dL 6 6 8   Creatinine 0.50 - 1.10 mg/dL 329(J 242(A  Sodium 135 - 145 mmol/L 137 135 136  Potassium 3.5 - 5.1 mmol/L 3.8 3.8 3.8  Chloride 96 - 112 mmol/L 110 108 104  CO2 19 - 32 mmol/L 23 21 22   Calcium 8.4 - 10.5 mg/dL 9.2 8.5 9.4  Total Protein 6.0 - 8.3 g/dL 8.34) - 6.5  Total Bilirubin 0.3 - 1.2 mg/dL 0.4 - 1.96)  Alkaline Phos 39 - 117 U/L 84 - 56  AST 0 - 37 U/L 37 - 26  ALT 0 - 35 U/L 30 - 18    Lipid Panel  No results found for: CHOL, TRIG, HDL, CHOLHDL, VLDL, LDLCALC, LDLDIRECT, LABVLDL  No components found for: NTPROBNP No results for input(s): PROBNP in the last 8760 hours. No results for input(s): TSH in the last 8760 hours.  BMP No results for input(s): NA, K, CL, CO2, GLUCOSE, BUN, CREATININE, CALCIUM, GFRNONAA, GFRAA in the last 8760 hours.  HEMOGLOBIN A1C No results found for: HGBA1C, MPG  External Labs: Collected: 02/27/2021 D-dimer 0.77. Troponin  T: Less than 6. TSH 1.02.  IMPRESSION:    ICD-10-CM   1. Palpitations  R00.2 EKG 12-Lead    LONG TERM MONITOR (3-14 DAYS)    2. Nonspecific abnormal electrocardiogram (ECG) (EKG)  R94.31 PCV ECHOCARDIOGRAM COMPLETE    3. SVT (supraventricular tachycardia) (HCC)  I47.1        RECOMMENDATIONS: Amber Davenport is a 40 y.o. female whose past medical history and cardiac risk factors include: hypertension, supraventricular tachycardia, history of COVID-19 infection (May 2022), gestational diabetes, postpartum depression, anxiety, panic attacks.  Palpitations: Most likely associated  with episodes of anxiety. No identifiable reversible cause. TSH within normal limits Recommended that she works on lifestyle changes to help decrease stress/anxiety. The shared decision was to proceed with an extended Holter monitor if her symptoms continue after initiation of sertraline that was initially prescribed by her PCP. 14-day extended Holter monitor ordered for now she will make an appointment to have it placed if her symptoms continue or worsen in intensity frequency and or duration. EKG shows sinus tachycardia with a short PR interval and without evidence of preexcitation. Educated on the importance of avoiding stimulant medications.  History of supraventricular tachycardia: That current symptoms are much less in intensity, frequency and duration as opposed to her prior episodes of SVT. She is encouraged to consider Valsalva maneuvers to help abort symptoms as they have helped in the past.  As a part of this consultation reviewed her symptoms, labs provided by PCP independently reviewed and summarized above, reviewed the reports of the CT PE protocol, prior cardiology notes from Dr. Royann Shivers from 2017, discussing disease management and work-up and providing coordination of care.  FINAL MEDICATION LIST END OF ENCOUNTER: No orders of the defined types were placed in this encounter.    Current Outpatient Medications:    sertraline (ZOLOFT) 25 MG tablet, Take 25 mg by mouth daily., Disp: , Rfl:   Orders Placed This Encounter  Procedures   LONG TERM MONITOR (3-14 DAYS)   EKG 12-Lead   PCV ECHOCARDIOGRAM COMPLETE    There are no Patient Instructions on file for this visit.   --Continue cardiac medications as reconciled in final medication list. --Return in about 4 weeks (around 04/17/2021) for Follow up, Palpitations. Or sooner if needed. --Continue follow-up with your primary care physician regarding the management of your other chronic comorbid conditions.  Patient's  questions and concerns were addressed to her satisfaction. She voices understanding of the instructions provided during this encounter.   This note was created using a voice recognition software as a result there may be grammatical errors inadvertently enclosed that do not reflect the nature of this encounter. Every attempt is made to correct such errors.  Tessa Lerner, [place], [facility]  Pager: [phone] Office: [phone]

## 2021-03-20 ENCOUNTER — Ambulatory Visit: Payer: No Typology Code available for payment source | Admitting: Cardiology

## 2021-03-20 ENCOUNTER — Other Ambulatory Visit: Payer: Self-pay

## 2021-03-20 ENCOUNTER — Encounter: Payer: Self-pay | Admitting: Cardiology

## 2021-03-20 VITALS — BP 150/88 | HR 107 | Temp 98.0°F | Resp 16 | Ht 64.0 in | Wt 177.0 lb

## 2021-03-20 DIAGNOSIS — I471 Supraventricular tachycardia: Secondary | ICD-10-CM

## 2021-03-20 DIAGNOSIS — R002 Palpitations: Secondary | ICD-10-CM

## 2021-03-20 DIAGNOSIS — R9431 Abnormal electrocardiogram [ECG] [EKG]: Secondary | ICD-10-CM

## 2021-03-22 ENCOUNTER — Ambulatory Visit (INDEPENDENT_AMBULATORY_CARE_PROVIDER_SITE_OTHER): Payer: No Typology Code available for payment source

## 2021-03-22 ENCOUNTER — Encounter: Payer: Self-pay | Admitting: Podiatry

## 2021-03-22 ENCOUNTER — Other Ambulatory Visit: Payer: Self-pay

## 2021-03-22 ENCOUNTER — Ambulatory Visit (INDEPENDENT_AMBULATORY_CARE_PROVIDER_SITE_OTHER): Payer: No Typology Code available for payment source | Admitting: Podiatry

## 2021-03-22 DIAGNOSIS — M205X2 Other deformities of toe(s) (acquired), left foot: Secondary | ICD-10-CM | POA: Diagnosis not present

## 2021-03-22 DIAGNOSIS — Z9889 Other specified postprocedural states: Secondary | ICD-10-CM

## 2021-03-22 NOTE — Progress Notes (Signed)
She presents today using a knee scooter date of surgery 02/23/2021 status post Lorenz Coaster bunion implant left.  States that is feeling better but is still swelling and gets real dark.  Objective: Vital signs are stable she alert oriented x3 there is no erythema to some mild edema no cellulitis drainage or odor sutures are intact she is got good range of motion though painful on top band.  Passive range of motion is easier than her active range of motion.  Assessment she appears to be healing very nicely there is no signs of infection.  Plan: Placed her in a compression anklet and a Darco shoe encouraged range of motion exercises, encouraged ambulation.  I will follow-up with her in 2 weeks

## 2021-03-28 ENCOUNTER — Other Ambulatory Visit: Payer: Self-pay

## 2021-03-28 ENCOUNTER — Ambulatory Visit: Payer: No Typology Code available for payment source

## 2021-03-28 DIAGNOSIS — R9431 Abnormal electrocardiogram [ECG] [EKG]: Secondary | ICD-10-CM

## 2021-04-05 ENCOUNTER — Encounter: Payer: Self-pay | Admitting: Podiatry

## 2021-04-05 ENCOUNTER — Other Ambulatory Visit: Payer: Self-pay

## 2021-04-05 ENCOUNTER — Ambulatory Visit (INDEPENDENT_AMBULATORY_CARE_PROVIDER_SITE_OTHER): Payer: No Typology Code available for payment source

## 2021-04-05 ENCOUNTER — Ambulatory Visit (INDEPENDENT_AMBULATORY_CARE_PROVIDER_SITE_OTHER): Payer: No Typology Code available for payment source | Admitting: Podiatry

## 2021-04-05 DIAGNOSIS — Z9889 Other specified postprocedural states: Secondary | ICD-10-CM

## 2021-04-05 DIAGNOSIS — M205X2 Other deformities of toe(s) (acquired), left foot: Secondary | ICD-10-CM

## 2021-04-05 NOTE — Progress Notes (Signed)
She presents today for follow-up of her surgical foot date of surgery is February 23, 2021 status post Lorenz Coaster bunion implant she says is feeling better still little numb feeling but other than that is doing pretty well.  Objective: Vital signs are stable alert oriented x3 there is no erythema to some mild edema no cellulitis drainage or odor she has good passive range of motion on plantarflexion as well as some active range of motion with plantarflexion but passive and active dorsiflexion is limited.  Most likely secondary to prolonged swelling.  Radiographs taken today demonstrate perfect alignment of a right medical single silicone implant with grommets.  No spurring no fractures.  Assessment: Well-healing surgical foot.  Plan: Encouraged range of motion exercises and physical therapy we will [facility] to [facility] to get a better range of motion going I will follow-up with her once they have completed their evaluation and treatment plan.

## 2021-04-25 ENCOUNTER — Ambulatory Visit: Payer: No Typology Code available for payment source | Admitting: Cardiology

## 2021-05-01 ENCOUNTER — Encounter: Payer: No Typology Code available for payment source | Admitting: Podiatry

## 2021-05-01 DIAGNOSIS — M205X2 Other deformities of toe(s) (acquired), left foot: Secondary | ICD-10-CM

## 2021-05-01 DIAGNOSIS — Z9889 Other specified postprocedural states: Secondary | ICD-10-CM

## 2021-05-24 ENCOUNTER — Encounter: Payer: No Typology Code available for payment source | Admitting: Podiatry

## 2021-05-29 ENCOUNTER — Encounter: Payer: No Typology Code available for payment source | Admitting: Podiatry

## 2021-06-14 ENCOUNTER — Other Ambulatory Visit: Payer: Self-pay

## 2021-06-14 ENCOUNTER — Ambulatory Visit (INDEPENDENT_AMBULATORY_CARE_PROVIDER_SITE_OTHER): Payer: No Typology Code available for payment source | Admitting: Podiatry

## 2021-06-14 ENCOUNTER — Ambulatory Visit (INDEPENDENT_AMBULATORY_CARE_PROVIDER_SITE_OTHER): Payer: No Typology Code available for payment source

## 2021-06-14 ENCOUNTER — Encounter: Payer: Self-pay | Admitting: Podiatry

## 2021-06-14 DIAGNOSIS — Z9889 Other specified postprocedural states: Secondary | ICD-10-CM

## 2021-06-14 DIAGNOSIS — M205X2 Other deformities of toe(s) (acquired), left foot: Secondary | ICD-10-CM

## 2021-06-17 NOTE — Progress Notes (Signed)
She presents today for her final postop visit date of surgery 02/23/2021 status post Lorenz Coaster bunion implant.  Last time she was and she was somewhat stiff with some swelling so we will send her to physical therapy and she states that is doing better but is still swelling.  Objective: Vital signs are stable alert and oriented x3.  Pulses are palpable.  There is mild edema but no erythema cellulitis drainage or odor incision sites going on to heal uneventfully she has fantastic range of motion pain-free in the first metatarsophalangeal joint.  She is very happy.  Radiographs taken today demonstrate Keller arthroplasty with single silicone implant intact with hallux sitting in good position.  Assessment: Well-healing surgical foot.  Plan: Get back to her regular routine and follow-up with [place] on an as-needed basis.  Should she have any questions or concerns regarding this she will feel free to notify us immediately.

## 2022-04-16 LAB — HEPATITIS C ANTIBODY: HCV Ab: NEGATIVE

## 2022-04-16 LAB — OB RESULTS CONSOLE ABO/RH: RH Type: POSITIVE

## 2022-04-16 LAB — OB RESULTS CONSOLE RUBELLA ANTIBODY, IGM: Rubella: IMMUNE

## 2022-04-16 LAB — OB RESULTS CONSOLE HEPATITIS B SURFACE ANTIGEN: Hepatitis B Surface Ag: NEGATIVE

## 2022-04-16 LAB — OB RESULTS CONSOLE RPR: RPR: NONREACTIVE

## 2022-04-16 LAB — OB RESULTS CONSOLE HIV ANTIBODY (ROUTINE TESTING): HIV: NONREACTIVE

## 2022-05-09 LAB — OB RESULTS CONSOLE GC/CHLAMYDIA
Chlamydia: NEGATIVE
Neisseria Gonorrhea: NEGATIVE

## 2022-07-26 ENCOUNTER — Telehealth: Payer: Self-pay

## 2022-07-26 NOTE — Telephone Encounter (Signed)
Discussed Dr. Odis Hollingshead recommendations with her she verbalized understanding.

## 2022-07-26 NOTE — Telephone Encounter (Signed)
She can keep her current appt.  It is common to have higher heart rate w/ pregnancy. However, if her symptoms increase in intensity, frequency, or duration have her call the on-call pager over the weekend otherwise go to ER for further evaluation.   Dr.Dorota Heinrichs

## 2022-07-26 NOTE — Telephone Encounter (Signed)
Patient is having palpitations and racing heart rate between 138-160. After doing vagal exercises her hr came down to 95 but is remaining in the 90's. She is 5 months pregnant. She has an appointment on 07/30/21. Patient wants to know if she should keep that appointment or are these symptoms normal with pregnancy. Similar symptoms happened with her previous pregnancy.

## 2022-07-29 NOTE — L&D Delivery Note (Signed)
Delivery Note At 10:55 AM a viable female was delivered via Vaginal, Spontaneous (Presentation: Middle   Anterior). Nuchal cord x 1 reduced  followed by delivery of the anterior shoulder and the rest of the body without difficulty. Delayed cord clamping was performed for 1 minute.   APGAR: 9, 9; weight 7 lb 12.2 oz (3520 g).   Placenta status: Spontaneous, Intact.  Cord: 3 vessels with the following complications: None.  Cord pH: NA  Anesthesia: None Episiotomy: None Lacerations: 2nd degree Suture Repair: 3.0 vicryl Est. Blood Loss (mL): 600 Uterine atony was present and resulted in postpartum hemorrhage. This was controlled with TXA 1 gram iv / cytotec 1000 mcg per rectum and pitocin.   Mom to postpartum.  Baby to Couplet care / Skin to Skin.  Gerald Leitz 11/20/2022, 11:43 AM

## 2022-07-30 ENCOUNTER — Encounter: Payer: Self-pay | Admitting: Cardiology

## 2022-07-30 ENCOUNTER — Ambulatory Visit: Payer: 59 | Admitting: Cardiology

## 2022-07-30 VITALS — BP 130/82 | HR 103 | Resp 18 | Ht 64.0 in | Wt 203.8 lb

## 2022-07-30 DIAGNOSIS — I471 Supraventricular tachycardia, unspecified: Secondary | ICD-10-CM

## 2022-07-30 DIAGNOSIS — R002 Palpitations: Secondary | ICD-10-CM

## 2022-07-30 DIAGNOSIS — R9431 Abnormal electrocardiogram [ECG] [EKG]: Secondary | ICD-10-CM

## 2022-07-30 MED ORDER — DILTIAZEM HCL ER 60 MG PO CP12: 60.0000 mg | ORAL_CAPSULE | Freq: Two times a day (BID) | ORAL | 0 refills | Status: DC | PRN

## 2022-07-30 NOTE — Progress Notes (Signed)
Date:  07/30/2022   ID:  Amber Davenport, DOB September 13, 1980, MRN [id number]  PCP:  Lorenda Ishihara, MD  Cardiologist:  Tessa Lerner, DO, [facility] (established care 03/20/2021) Former Cardiology Providers: Thurmon Fair, MD  Date: 07/30/22 Last Office Visit: 03/20/2021  Chief Complaint  Patient presents with   Palpitations    HPI  Amber Davenport is a 42 y.o. female whose past medical history and cardiovascular risk factors include: hypertension, supraventricular tachycardia, history of COVID-19 infection (May 2022), gestational diabetes, postpartum depression, anxiety, panic attacks.  Patient was referred to the practice for evaluation and management of palpitations.  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  She is accompanied by her husband Jomarie Longs at today's visit.  Since last office visit, she is now on her fifth month of pregnancy and overall doing well.  However, on July 26, 2022 she woke up early in the morning and noticed elevated heart rates in the 160 bpm range, symptoms lasted for approximately 30 minutes, improved with bearing down, breathing exercises, taking a cold shower.  Shortly thereafter her heart rate improved to 90 bpm.  Overall remained asymptomatic and has not experienced lightheadedness/dizziness/near syncope or syncope.  She is getting routine labs with her OB/GYN.  I do not have them available for review but states that the hemoglobin and TSH are within acceptable limits.  FUNCTIONAL STATUS: Works out a day - cross fit.     ALLERGIES: Allergies  Allergen Reactions   Oxycodone Itching   Latex Itching    MEDICATION LIST PRIOR TO VISIT: Current Meds  Medication Sig   diltiazem (CARDIZEM SR) 60 MG 12 hr capsule Take 1 capsule (60 mg total) by mouth 2 (two) times daily as needed for up to 30 doses (tachycardia).   Prenatal Vit-Fe Fumarate-FA  (PRENATAL MULTIVITAMIN) TABS tablet Take 1 tablet by mouth daily at 12 noon.     PAST MEDICAL HISTORY: Past Medical History:  Diagnosis Date   Anemia    Dysrhythmia 07/29/2013   rapid heartbeat, cardioverted twice in last three weeks using Adenosine.   Gestational diabetes    Irritable bowel syndrome    since high school   SVT (supraventricular tachycardia) 07/29/2014   during current pregnancy    PAST SURGICAL HISTORY: Past Surgical History:  Procedure Laterality Date   FOOT SURGERY Left    MOUTH SURGERY  07/29/2002    FAMILY HISTORY: The patient family history includes Asthma in her brother; Diabetes in her brother and mother; Drug abuse in her maternal grandfather; Hypertension in her brother, maternal grandmother, and mother.  SOCIAL HISTORY:  The patient  reports that she has never smoked. She has never used smokeless tobacco. She reports that she does not drink alcohol and does not use drugs.  REVIEW OF SYSTEMS: Review of Systems  Cardiovascular:  Positive for palpitations (At times, currently stable). Negative for chest pain, claudication, dyspnea on exertion, irregular heartbeat, leg swelling, near-syncope, orthopnea, paroxysmal nocturnal dyspnea and syncope.  Respiratory:  Negative for shortness of breath.   Hematologic/Lymphatic: Negative for bleeding problem.  Musculoskeletal:  Negative for muscle cramps and myalgias.  Neurological:  Negative for dizziness and light-headedness.    PHYSICAL EXAM:    07/30/2022    1:59 PM 03/20/2021   11:18 AM 03/20/2021   11:03 AM  Vitals with BMI  Height 5\' 4"   5\' 4"   Weight 203 lbs 13 oz  177 lbs  BMI 34.96  30.37  Systolic 381 017 510  Diastolic 82 88 91  Pulse 258 107 111    CONSTITUTIONAL: Well-developed and well-nourished. No acute distress.  SKIN: Skin is warm and dry. No rash noted. No cyanosis. No pallor. No jaundice HEAD: Normocephalic and atraumatic.  EYES: No scleral icterus MOUTH/THROAT: Moist oral  membranes.  NECK: No JVD present. No thyromegaly noted. No carotid bruits  LYMPHATIC: No visible cervical adenopathy.  CHEST Normal respiratory effort. No intercostal retractions  LUNGS: Clear to auscultation bilaterally.  No stridor. No wheezes. No rales.  CARDIOVASCULAR: Regular rate and rhythm, positive S1-S2, no murmurs rubs or gallops appreciated ABDOMINAL: Gravid uterus, nontender, nondistended, positive bowel sounds in all 4 quadrants, no apparent ascites.  EXTREMITIES: No peripheral edema, boot present over the left foot, 2+ PT pulses. HEMATOLOGIC: No significant bruising NEUROLOGIC: Oriented to person, place, and time. Nonfocal. Normal muscle tone.  PSYCHIATRIC: Normal mood and affect. Normal behavior. Cooperative  CARDIAC DATABASE: EKG: 03/20/2021: Sinus tachycardia, 101 bpm, short PR interval, nonspecific T wave abnormality, without underlying injury pattern  07/30/2022: Sinus tachycardia, 104 bpm, nonspecific T wave abnormality.  Echocardiogram: 03/28/2021:  Left ventricle cavity is normal in size and wall thickness. Normal global wall motion. Normal LV systolic function with EF 70%. Normal diastolic filling pattern.  No significant valvular abnormality.  Normal right atrial pressure.   Stress Testing: No results found for this or any previous visit from the past 1095 days.  Heart Catheterization: None  RADIOLOGY: CT PE protocol.  02/28/2021: Normal CTA of the chest.  No evidence of pulmonary embolism or other acute findings.  LABORATORY DATA:    Latest Ref Rng & Units 09/12/2014    5:55 AM 09/11/2014    5:25 PM 09/11/2014    6:00 AM  CBC  WBC 4.0 - 10.5 K/uL 11.0  12.6  11.5   Hemoglobin 12.0 - 15.0 g/dL 10.2  11.2  12.3   Hematocrit 36.0 - 46.0 % 30.8  33.6  36.5   Platelets 150 - 400 K/uL 143  149  161        Latest Ref Rng & Units 09/11/2014    5:25 PM 08/10/2014    9:32 PM 07/25/2014    4:45 PM  CMP  Glucose 70 - 99 mg/dL 82  185  104   BUN 6 - 23 mg/dL 6   6  8    Creatinine 0.50 - 1.10 mg/dL 0.54  0.53  0.51   Sodium 135 - 145 mmol/L 137  135  136   Potassium 3.5 - 5.1 mmol/L 3.8  3.8  3.8   Chloride 96 - 112 mmol/L 110  108  104   CO2 19 - 32 mmol/L 23  21  22    Calcium 8.4 - 10.5 mg/dL 9.2  8.5  9.4   Total Protein 6.0 - 8.3 g/dL 5.8   6.5   Total Bilirubin 0.3 - 1.2 mg/dL 0.4   0.2   Alkaline Phos 39 - 117 U/L 84   56   AST 0 - 37 U/L 37   26   ALT 0 - 35 U/L 30   18     Lipid Panel  No results found for: "CHOL", "TRIG", "HDL", "CHOLHDL", "VLDL", "LDLCALC", "LDLDIRECT", "LABVLDL"  No components found for: "NTPROBNP" No results for input(s): "PROBNP" in the last 8760 hours. No results for input(s): "TSH" in the last 8760 hours.  BMP No results for input(s): "NA", "K", "CL", "CO2", "GLUCOSE", "BUN", "CREATININE", "CALCIUM", "GFRNONAA", "GFRAA" in the last  8760 hours.  HEMOGLOBIN A1C No results found for: "HGBA1C", "MPG"  External Labs: Collected: 02/27/2021 D-dimer 0.77. Troponin T: Less than 6. TSH 1.02.  IMPRESSION:    ICD-10-CM   1. Palpitations  R00.2 EKG 12-Lead    diltiazem (CARDIZEM SR) 60 MG 12 hr capsule    2. Nonspecific abnormal electrocardiogram (ECG) (EKG)  R94.31 EKG 12-Lead    3. SVT (supraventricular tachycardia)  I47.10 EKG 12-Lead    diltiazem (CARDIZEM SR) 60 MG 12 hr capsule       RECOMMENDATIONS: Bellamarie Pflug is a 42 y.o. female whose past medical history and cardiac risk factors include: hypertension, supraventricular tachycardia, history of COVID-19 infection (May 2022), gestational diabetes, postpartum depression, anxiety, panic attacks.  Patient has known history of paroxysmal supraventricular tachycardia which response to adenosine as well as vagal maneuvers.  Clinically was doing well until recently on July 26, 2022 she had an episode of palpitations which resolved with breathing exercising, vagal maneuver, and taking a cold shower.  Since last office visit patient is now 5 months  into her third pregnancy and overall doing well.  She routinely follows up with her OB/GYN and states that her hemoglobin and thyroid function are within acceptable limits.  I do not have those labs available for review at this time.  During her prior pregnancies that she is also had episodes of PSVT/palpitations.  She has not required medical therapy in the past but is contemplating the need of it.  I have advised both the patient and her husband to minimize pharmacological therapy as long as she remains asymptomatic.  However for now we will prescribe diltiazem 60 mg p.o. twice daily on as needed basis.  I have asked her to inform her OB/GYN of the change and if she uses it.  Echocardiogram from August 2022 notes preserved LVEF without any significant valvular heart disease.  I would like to see her back in 6 months sooner if needed.   FINAL MEDICATION LIST END OF ENCOUNTER: Meds ordered this encounter  Medications   diltiazem (CARDIZEM SR) 60 MG 12 hr capsule    Sig: Take 1 capsule (60 mg total) by mouth 2 (two) times daily as needed for up to 30 doses (tachycardia).    Dispense:  30 capsule    Refill:  0     Current Outpatient Medications:    diltiazem (CARDIZEM SR) 60 MG 12 hr capsule, Take 1 capsule (60 mg total) by mouth 2 (two) times daily as needed for up to 30 doses (tachycardia)., Disp: 30 capsule, Rfl: 0   Prenatal Vit-Fe Fumarate-FA (PRENATAL MULTIVITAMIN) TABS tablet, Take 1 tablet by mouth daily at 12 noon., Disp: , Rfl:   Orders Placed This Encounter  Procedures   EKG 12-Lead    There are no Patient Instructions on file for this visit.   --Continue cardiac medications as reconciled in final medication list. --Return in about 6 months (around 01/28/2023) for Follow up hx of SVT. Or sooner if needed. --Continue follow-up with your primary care physician regarding the management of your other chronic comorbid conditions.  Patient's questions and concerns were addressed  to her satisfaction. She voices understanding of the instructions provided during this encounter.   This note was created using a voice recognition software as a result there may be grammatical errors inadvertently enclosed that do not reflect the nature of this encounter. Every attempt is made to correct such errors.  Rex Kras, [place], [facility]  Pager: [phone] Office: [phone]

## 2022-09-04 LAB — OB RESULTS CONSOLE RPR: RPR: NONREACTIVE

## 2022-10-24 LAB — OB RESULTS CONSOLE GBS: GBS: POSITIVE

## 2022-11-04 ENCOUNTER — Other Ambulatory Visit: Payer: Self-pay

## 2022-11-04 ENCOUNTER — Encounter (HOSPITAL_BASED_OUTPATIENT_CLINIC_OR_DEPARTMENT_OTHER): Payer: Self-pay | Admitting: Emergency Medicine

## 2022-11-04 ENCOUNTER — Emergency Department (HOSPITAL_BASED_OUTPATIENT_CLINIC_OR_DEPARTMENT_OTHER)
Admission: EM | Admit: 2022-11-04 | Discharge: 2022-11-04 | Disposition: A | Discharge status: Home / Self Care | Payer: 59 | Attending: Emergency Medicine | Admitting: Emergency Medicine

## 2022-11-04 ENCOUNTER — Telehealth: Payer: Self-pay | Admitting: Cardiology

## 2022-11-04 DIAGNOSIS — Z91040 Latex allergy status: Secondary | ICD-10-CM | POA: Diagnosis not present

## 2022-11-04 DIAGNOSIS — I471 Supraventricular tachycardia, unspecified: Secondary | ICD-10-CM

## 2022-11-04 DIAGNOSIS — O368331 Maternal care for abnormalities of the fetal heart rate or rhythm, third trimester, fetus 1: Secondary | ICD-10-CM | POA: Insufficient documentation

## 2022-11-04 DIAGNOSIS — R002 Palpitations: Secondary | ICD-10-CM

## 2022-11-04 DIAGNOSIS — Z3A36 36 weeks gestation of pregnancy: Secondary | ICD-10-CM | POA: Diagnosis not present

## 2022-11-04 LAB — CBC WITH DIFFERENTIAL/PLATELET
Abs Immature Granulocytes: 0.1 10*3/uL — ABNORMAL HIGH (ref 0.00–0.07)
Basophils Absolute: 0 10*3/uL (ref 0.0–0.1)
Basophils Relative: 0 %
Eosinophils Absolute: 0.1 10*3/uL (ref 0.0–0.5)
Eosinophils Relative: 1 %
HCT: 30.2 % — ABNORMAL LOW (ref 36.0–46.0)
Hemoglobin: 9.7 g/dL — ABNORMAL LOW (ref 12.0–15.0)
Immature Granulocytes: 1 %
Lymphocytes Relative: 22 %
Lymphs Abs: 2.2 10*3/uL (ref 0.7–4.0)
MCH: 26.9 pg (ref 26.0–34.0)
MCHC: 32.1 g/dL (ref 30.0–36.0)
MCV: 83.7 fL (ref 80.0–100.0)
Monocytes Absolute: 0.9 10*3/uL (ref 0.1–1.0)
Monocytes Relative: 9 %
Neutro Abs: 6.6 10*3/uL (ref 1.7–7.7)
Neutrophils Relative %: 67 %
Platelets: 146 10*3/uL — ABNORMAL LOW (ref 150–400)
RBC: 3.61 MIL/uL — ABNORMAL LOW (ref 3.87–5.11)
RDW: 14.1 % (ref 11.5–15.5)
WBC: 9.8 10*3/uL (ref 4.0–10.5)
nRBC: 0 % (ref 0.0–0.2)

## 2022-11-04 LAB — BASIC METABOLIC PANEL
Anion gap: 7 (ref 5–15)
BUN: 6 mg/dL (ref 6–20)
CO2: 22 mmol/L (ref 22–32)
Calcium: 8.1 mg/dL — ABNORMAL LOW (ref 8.9–10.3)
Chloride: 108 mmol/L (ref 98–111)
Creatinine, Ser: 0.52 mg/dL (ref 0.44–1.00)
GFR, Estimated: >60 mL/min (ref >60)
Glucose, Bld: 98 mg/dL (ref 70–99)
Potassium: 3.6 mmol/L (ref 3.5–5.1)
Sodium: 137 mmol/L (ref 135–145)

## 2022-11-04 MED ORDER — ADENOSINE 6 MG/2ML IV SOLN
INTRAVENOUS | Status: DC | PRN
  Administered 2022-11-04: 6 mg via INTRAVENOUS

## 2022-11-04 MED ORDER — SODIUM CHLORIDE 0.9 % IV BOLUS
1000.0000 mL | Freq: Once | INTRAVENOUS | Status: AC
  Administered 2022-11-04: 1000 mL via INTRAVENOUS

## 2022-11-04 MED ORDER — ADENOSINE 6 MG/2ML IV SOLN
INTRAVENOUS | Status: AC
  Filled 2022-11-04: qty 6

## 2022-11-04 NOTE — Discharge Instructions (Addendum)
Your heart rate converted from SVT to sinus tachycardia following fluids and Adenosine. Please follow-up with your cardiologist and OBGYN. Continue to take your diltiazem

## 2022-11-04 NOTE — Code Documentation (Signed)
[id number]

## 2022-11-04 NOTE — Code Documentation (Signed)
Confirmed with MAU rapid response RN that fetal monitoring in place and visible from MAU

## 2022-11-04 NOTE — Telephone Encounter (Signed)
ON-CALL CARDIOLOGY 11/04/22  Patient's name: Amber Davenport.   MRN: [id number].    DOB: 1980-11-27 Primary care provider: Lorenda Ishihara, MD. Primary cardiologist: Tessa Lerner, DO, [facility]  Interaction regarding this patient's care today: Called answering service with regards to her symptoms of palpitations.  Spoke to the husband over the phone and patient was in the background to be determined.  He has been having for the last 3 hours or so.  She did take diltiazem 60 mg p.o. x 1 approximately 1 and half hours ago.  Her current heart rate is 140 bpm.  She is currently [redacted] weeks gestation.  She has a known history of PSVT in the past.  Impression:   ICD-10-CM   1. Palpitations  R00.2     2. SVT (supraventricular tachycardia)  I47.10       No orders of the defined types were placed in this encounter.   No orders of the defined types were placed in this encounter.   Recommendations: Given the fact that she is [redacted] weeks gestation, has had a history of PSVT in the past, has already taken her diltiazem approximately 1.5 hours ago and her heart rate is still around 140 bpm have advised her to go to the ED and be evaluated.  Telephone encounter total time: .   Tessa Lerner, [place], [facility]  Pager:  [phone] Office: [phone]

## 2022-11-04 NOTE — Code Documentation (Signed)
FHR 150

## 2022-11-04 NOTE — ED Triage Notes (Signed)
"  My heart rate is elevated" per patient. Per watch 160-170. Feels heart racing, denies sob. [redacted] weeks pregnant. Took diltiiazem ER 60mg  4pm Denies pain, contractions

## 2022-11-04 NOTE — ED Provider Notes (Addendum)
[place] EMERGENCY DEPARTMENT AT [facility] Provider Note   CSN: [id number] Arrival date & time: 11/04/22  8270     History  Chief Complaint  Patient presents with   Tachycardia    Amber Davenport is a 42 y.o. female.  HPI   42 year old female [redacted] weeks pregnant who presents to the emergency department in SVT.  The patient states that she has a history of SVT and has had 2 undergo vagal maneuvers and received adenosine while pregnant previously.  She took Cardizem earlier today when she noticed that she was having palpitations.  She felt like her heart was racing.  Denied any chest pain or shortness of breath.  She also denies any vaginal bleeding, leakage of fluid, pelvic pain or abdominal pain.  She called her cardiologist office, [facility] and was advised to present to the emergency department for further evaluation.  She states that she has been hydrating and drinking plenty of water.  She denies any fever, chills, cough.  Home Medications Prior to Admission medications   Medication Sig Start Date End Date Taking? Authorizing Provider  diltiazem (CARDIZEM SR) 60 MG 12 hr capsule Take 1 capsule (60 mg total) by mouth 2 (two) times daily as needed for up to 30 doses (tachycardia). 07/30/22   Tessa Lerner, DO  Prenatal Vit-Fe Fumarate-FA (PRENATAL MULTIVITAMIN) TABS tablet Take 1 tablet by mouth daily at 12 noon.    [provider]      Allergies    Oxycodone and Latex    Review of Systems   Review of Systems  All other systems reviewed and are negative.   Physical Exam Updated Vital Signs BP 122/84   Pulse (!) 102   Temp 98.2 F (36.8 C) (Oral)   Resp 12   SpO2 100%  Physical Exam Vitals and nursing note reviewed.  Constitutional:      General: She is not in acute distress.    Appearance: She is well-developed.  HENT:     Head: Normocephalic and atraumatic.  Eyes:     Conjunctiva/sclera: Conjunctivae normal.  Cardiovascular:      Rate and Rhythm: Regular rhythm. Tachycardia present.  Pulmonary:     Effort: Pulmonary effort is normal. No respiratory distress.     Breath sounds: Normal breath sounds.  Abdominal:     Palpations: Abdomen is soft.     Tenderness: There is no abdominal tenderness.     Comments: Gravid abdomen  Musculoskeletal:        General: No swelling.     Cervical back: Neck supple.  Skin:    General: Skin is warm and dry.     Capillary Refill: Capillary refill takes less than 2 seconds.  Neurological:     Mental Status: She is alert.  Psychiatric:        Mood and Affect: Mood normal.     ED Results / Procedures / Treatments   Labs (all labs ordered are listed, but only abnormal results are displayed) Labs Reviewed  CBC WITH DIFFERENTIAL/PLATELET - Abnormal; Notable for the following components:      Result Value   RBC 3.61 (*)    Hemoglobin 9.7 (*)    HCT 30.2 (*)    Platelets 146 (*)    Abs Immature Granulocytes 0.10 (*)    All other components within normal limits  BASIC METABOLIC PANEL    EKG EKG Interpretation  Date/Time:  Monday November 04 2022 19:36:01 EDT Ventricular Rate:  129 PR Interval:  123 QRS Duration: 77 QT Interval:  318 QTC Calculation: 466 R Axis:   56 Text Interpretation: Sinus tachycardia Minimal ST depression, diffuse leads Baseline wander in lead(s) V4 V5 Confirmed by Ernie Avena ([id number]) on 11/04/2022 7:56:09 PM  Radiology No results found.  Procedures Procedures    Medications Ordered in ED Medications  adenosine (ADENOCARD) 6 MG/2ML injection (  Not Given 11/04/22 2012)  adenosine (ADENOCARD) 6 MG/2ML injection (6 mg Intravenous Given 11/04/22 1934)  sodium chloride 0.9 % bolus 1,000 mL (1,000 mLs Intravenous New Bag/Given 11/04/22 1856)    ED Course/ Medical Decision Making/ A&P                             Medical Decision Making Amount and/or Complexity of Data Reviewed Labs: ordered.  Risk Prescription drug management. Decision regarding  hospitalization.    42 year old female [redacted] weeks pregnant who presents to the emergency department in SVT.  The patient states that she has a history of SVT and has had 2 undergo vagal maneuvers and received adenosine while pregnant previously.  She took Cardizem earlier today when she noticed that she was having palpitations.  She felt like her heart was racing.  Denied any chest pain or shortness of breath.  She also denies any vaginal bleeding, leakage of fluid, pelvic pain or abdominal pain.  She called her cardiologist office, [facility] and was advised to present to the emergency department for further evaluation.  She states that she has been hydrating and drinking plenty of water.  She denies any fever, chills, cough.  On arrival, the patient was afebrile, tachycardic heart rate in the 160s, tachypneic RR 21, BP 151/97, saturating her percent on room air.  SVT was noted on cardiac telemetry.  An EKG was performed revealed SVT with a heart rate of 158.  Patient was placed on cardiac telemetry and a ZOLL pad was placed.  Initial IV access was obtained and the patient was administered a single IV fluid bolus of 1 L.  Vagal maneuvers were tried x 2 which were ultimately unsuccessful.  The patient was then administered 6 mg of IV adenosine and subsequently converted to sinus tachycardia.  She remained hemodynamically stable.  Fetal heart tones had been appreciated in the 140s.  NST monitoring was obtained which was remotely monitored by on-call rapid response OB nursing.  The patient remained hemodynamically stable and did not have recurrence of her SVT.  She was observed in the emergency department with no recurrence of her symptoms.  I discussed the care of the patient with Dr. Adrian Blackwater, on-call OB/GYN who did not feel that the patient needed transfer to the MAU for further monitoring upon review of her presentation and review of the fetal heart monitoring.  Patient is stable at this time for  follow-up outpatient with her OB, was advised to continue to keep taking her diltiazem. Stable for discharge.  Final Clinical Impression(s) / ED Diagnoses Final diagnoses:  SVT (supraventricular tachycardia)    Rx / DC Orders ED Discharge Orders     None         Ernie Avena, MD 11/04/22 2013    Ernie Avena, MD 11/04/22 2014

## 2022-11-04 NOTE — Code Documentation (Signed)
Time out performed at this time. Dr. Karene Fry (EDP); [place], RN; Corrie Dandy, RN; Florentina Addison, RT; Amy, NT at bedside

## 2022-11-04 NOTE — Progress Notes (Signed)
[facility] received call from [facility] staff regarding patient presenting ""My heart rate is elevated" per patient. Per watch 160-170. Feels heart racing, denies sob. [redacted] weeks pregnant. Took diltiiazem ER 60mg  4pm Denies pain, contractions"  [facility] staff placed patient on monitors 1924 FHR 145.   RROB spoke with Dr. Normand Sloop, verbal orders received that [facility] is to stabilize the patient and once that is achieved, patient can be transferred to MAU.   RROB spoke to [facility] staff with above verbal orders, [facility] staff verbalized understanding of the orders.   [facility] staff called RROB at 1940 and advised patient received 1 dose (6mg ) of adenosine and cardioverted back to sinus tach. Patients HR 106 and blood pressure 126/83, patient tolerated procedure well and is stable. Advised patient should be transferred to MAU.   RROB spoke with Dr. Normand Sloop at [phone] and advised the above information as well as FHR still 140-150 with good variability. Advised that patient will be transferred to MAU.    1957  RROB received call from Dr. Adrian Blackwater. Requesting if infant has good variability and is reactive. Advised that [facility] is having trouble keeping infant on monitor but I am getting 2-3 minutes of strip at a time and what I see is good variability and accels at 1936, 1941, 1948. Dr. Adrian Blackwater advised that the patient did not need to come to MAU,  she was cleared from [facility], and is stable for discharge from [facility].    Lovenia Shuck, RN RROB

## 2022-11-18 ENCOUNTER — Telehealth (HOSPITAL_COMMUNITY): Payer: Self-pay | Admitting: *Deleted

## 2022-11-18 NOTE — Telephone Encounter (Signed)
Preadmission screen  

## 2022-11-19 ENCOUNTER — Encounter (HOSPITAL_COMMUNITY): Payer: Self-pay | Admitting: *Deleted

## 2022-11-19 ENCOUNTER — Encounter (HOSPITAL_COMMUNITY): Payer: Self-pay | Admitting: Obstetrics and Gynecology

## 2022-11-19 ENCOUNTER — Encounter (HOSPITAL_COMMUNITY): Payer: Self-pay

## 2022-11-19 ENCOUNTER — Telehealth (HOSPITAL_COMMUNITY): Payer: Self-pay | Admitting: *Deleted

## 2022-11-19 ENCOUNTER — Inpatient Hospital Stay (HOSPITAL_COMMUNITY)
Admission: AD | Admit: 2022-11-19 | Discharge: 2022-11-21 | DRG: 807 | Disposition: A | Discharge status: Home / Self Care | Payer: Managed Care, Other (non HMO) | Attending: Obstetrics and Gynecology | Admitting: Obstetrics and Gynecology

## 2022-11-19 DIAGNOSIS — O99824 Streptococcus B carrier state complicating childbirth: Secondary | ICD-10-CM | POA: Diagnosis present

## 2022-11-19 DIAGNOSIS — R000 Tachycardia, unspecified: Secondary | ICD-10-CM | POA: Diagnosis present

## 2022-11-19 DIAGNOSIS — O133 Gestational [pregnancy-induced] hypertension without significant proteinuria, third trimester: Secondary | ICD-10-CM | POA: Diagnosis not present

## 2022-11-19 DIAGNOSIS — O24410 Gestational diabetes mellitus in pregnancy, diet controlled: Secondary | ICD-10-CM | POA: Diagnosis not present

## 2022-11-19 DIAGNOSIS — O134 Gestational [pregnancy-induced] hypertension without significant proteinuria, complicating childbirth: Secondary | ICD-10-CM | POA: Diagnosis present

## 2022-11-19 DIAGNOSIS — O139 Gestational [pregnancy-induced] hypertension without significant proteinuria, unspecified trimester: Secondary | ICD-10-CM | POA: Diagnosis present

## 2022-11-19 DIAGNOSIS — O26893 Other specified pregnancy related conditions, third trimester: Secondary | ICD-10-CM | POA: Diagnosis present

## 2022-11-19 DIAGNOSIS — O99892 Other specified diseases and conditions complicating childbirth: Secondary | ICD-10-CM | POA: Diagnosis present

## 2022-11-19 DIAGNOSIS — Z3A40 40 weeks gestation of pregnancy: Secondary | ICD-10-CM

## 2022-11-19 HISTORY — DX: Gestational diabetes mellitus in pregnancy, insulin controlled: O24.414

## 2022-11-19 HISTORY — DX: Supervision of pregnancy with history of pre-term labor, unspecified trimester: O09.219

## 2022-11-19 LAB — COMPREHENSIVE METABOLIC PANEL
ALT: 23 U/L (ref 0–44)
AST: 30 U/L (ref 15–41)
Albumin: 3 g/dL — ABNORMAL LOW (ref 3.5–5.0)
Alkaline Phosphatase: 135 U/L — ABNORMAL HIGH (ref 38–126)
Anion gap: 10 (ref 5–15)
BUN: <5 mg/dL — ABNORMAL LOW (ref 6–20)
CO2: 23 mmol/L (ref 22–32)
Calcium: 9.4 mg/dL (ref 8.9–10.3)
Chloride: 101 mmol/L (ref 98–111)
Creatinine, Ser: 0.62 mg/dL (ref 0.44–1.00)
GFR, Estimated: >60 mL/min (ref >60)
Glucose, Bld: 89 mg/dL (ref 70–99)
Potassium: 4.2 mmol/L (ref 3.5–5.1)
Sodium: 134 mmol/L — ABNORMAL LOW (ref 135–145)
Total Bilirubin: 0.4 mg/dL (ref 0.3–1.2)
Total Protein: 6.8 g/dL (ref 6.5–8.1)

## 2022-11-19 LAB — CBC
HCT: 34 % — ABNORMAL LOW (ref 36.0–46.0)
Hemoglobin: 11 g/dL — ABNORMAL LOW (ref 12.0–15.0)
MCH: 27 pg (ref 26.0–34.0)
MCHC: 32.4 g/dL (ref 30.0–36.0)
MCV: 83.5 fL (ref 80.0–100.0)
Platelets: 174 10*3/uL (ref 150–400)
RBC: 4.07 MIL/uL (ref 3.87–5.11)
RDW: 14.5 % (ref 11.5–15.5)
WBC: 10.5 10*3/uL (ref 4.0–10.5)
nRBC: 0 % (ref 0.0–0.2)

## 2022-11-19 LAB — TYPE AND SCREEN
ABO/RH(D): O POS
Antibody Screen: NEGATIVE

## 2022-11-19 LAB — PROTEIN / CREATININE RATIO, URINE
Creatinine, Urine: 83 mg/dL
Protein Creatinine Ratio: 0.17 mg/mg{Cre} — ABNORMAL HIGH (ref 0.00–0.15)
Total Protein, Urine: 14 mg/dL

## 2022-11-19 LAB — AMNISURE RUPTURE OF MEMBRANE (ROM) NOT AT ARMC: Amnisure ROM: NEGATIVE

## 2022-11-19 MED ORDER — TERBUTALINE SULFATE 1 MG/ML IJ SOLN: 0.2500 mg | Freq: Once | INTRAMUSCULAR | Status: DC | PRN

## 2022-11-19 MED ORDER — FENTANYL CITRATE (PF) 100 MCG/2ML IJ SOLN: 50.0000 ug | INTRAMUSCULAR | Status: DC | PRN

## 2022-11-19 MED ORDER — PENICILLIN G POT IN DEXTROSE 60000 UNIT/ML IV SOLN
3.0000 10*6.[IU] | INTRAVENOUS | Status: DC
  Administered 2022-11-20 (×2): 3 10*6.[IU] via INTRAVENOUS
  Filled 2022-11-19 (×2): qty 50

## 2022-11-19 MED ORDER — ONDANSETRON HCL 4 MG/2ML IJ SOLN: 4.0000 mg | Freq: Four times a day (QID) | INTRAMUSCULAR | Status: DC | PRN

## 2022-11-19 MED ORDER — OXYTOCIN-SODIUM CHLORIDE 30-0.9 UT/500ML-% IV SOLN
2.5000 [IU]/h | INTRAVENOUS | Status: DC
  Administered 2022-11-20: 2.5 [IU]/h via INTRAVENOUS
  Filled 2022-11-19: qty 500

## 2022-11-19 MED ORDER — MISOPROSTOL 25 MCG QUARTER TABLET
25.0000 ug | ORAL_TABLET | Freq: Once | ORAL | Status: AC
  Administered 2022-11-20: 25 ug via VAGINAL
  Filled 2022-11-19: qty 1

## 2022-11-19 MED ORDER — HYDROXYZINE HCL 50 MG PO TABS: 50.0000 mg | ORAL_TABLET | Freq: Four times a day (QID) | ORAL | Status: DC | PRN

## 2022-11-19 MED ORDER — LACTATED RINGERS IV SOLN: INTRAVENOUS | Status: DC

## 2022-11-19 MED ORDER — SODIUM CHLORIDE 0.9 % IV SOLN
5.0000 10*6.[IU] | Freq: Once | INTRAVENOUS | Status: AC
  Administered 2022-11-19: 5 10*6.[IU] via INTRAVENOUS
  Filled 2022-11-19: qty 5

## 2022-11-19 MED ORDER — OXYTOCIN BOLUS FROM INFUSION
333.0000 mL | Freq: Once | INTRAVENOUS | Status: AC
  Administered 2022-11-20: 333 mL via INTRAVENOUS

## 2022-11-19 MED ORDER — LACTATED RINGERS IV SOLN: 500.0000 mL | INTRAVENOUS | Status: DC | PRN

## 2022-11-19 MED ORDER — ACETAMINOPHEN 325 MG PO TABS: 650.0000 mg | ORAL_TABLET | ORAL | Status: DC | PRN

## 2022-11-19 MED ORDER — SOD CITRATE-CITRIC ACID 500-334 MG/5ML PO SOLN: 30.0000 mL | ORAL | Status: DC | PRN

## 2022-11-19 MED ORDER — LIDOCAINE HCL (PF) 1 % IJ SOLN: 30.0000 mL | INTRAMUSCULAR | Status: AC | PRN

## 2022-11-19 MED ORDER — MISOPROSTOL 25 MCG QUARTER TABLET
25.0000 ug | ORAL_TABLET | Freq: Once | ORAL | Status: AC
  Administered 2022-11-20: 25 ug via ORAL
  Filled 2022-11-19: qty 1

## 2022-11-19 NOTE — MAU Note (Signed)
.  Amber Davenport is a 42 y.o. at [redacted]w[redacted]d here in MAU reporting: gush of clear watery fluid while she was in the shower this morning at 1130. Has not felt any fluid since. Deneis VB. +FM. Denies ctx but has had some pelvic pain and pressure.   Pain score: 5 Vitals:   11/19/22 1433  BP: (!) 140/77  Pulse: 90  Resp: 14  Temp: 98.1 F (36.7 C)  SpO2: 98%     FHT:143 Lab orders placed from triage:  mau labor

## 2022-11-19 NOTE — H&P (Signed)
Amber Davenport is a 42 y.o. female [id number] presenting to MAU to rule out rupture. She had a gush of fluid at home aroudn 1130 while in the shower. Her amniosure was negative in MAU however bp was elevated. She denies headache visual disturbances or ruq pain. +FM no Vaginal bleeding. No regular contractions.  Pregnancy complicated by AMA/ H/O gestational hypertension with previous pregnancy and h/o preterm delivery. Prenatal care provided by Dr. Gerald Leitz with [facility].  11/19/2022 ultrasound EFW 8 lbs 7 oz (68%ile) BPP8/8.Marland Kitchen placenta Anterior and Fundal.   OB History     Gravida  3   Para  2   Term  1   Preterm  1   AB  0   Living  2      SAB  0   IAB  0   Ectopic  0   Multiple      Live Births  2          PMH: h/o gestational hypertension and gestational diabetes with previous pregnancy.  Intermittent Tachycardia   Medications: PNV 81 mg Aspirin  Diltiazem ER 60 mg every 12 hours prn tachycardia   Allergies : latex  Oxycodone- itching.  Past Surgical History:  Procedure Laterality Date   FOOT SURGERY Left    MOUTH SURGERY  07/29/2002   Family History: family history includes Asthma in her brother; Diabetes in her brother and mother; Drug abuse in her maternal grandfather; Hypertension in her brother, maternal grandmother, and mother. Social History:  reports that she has never smoked. She has never used smokeless tobacco. She reports that she does not drink alcohol and does not use drugs.     Maternal Diabetes: No Genetic Screening: Declined Maternal Ultrasounds/Referrals: Normal Fetal Ultrasounds or other Referrals:  None Maternal Substance Abuse:  No Significant Maternal Medications:  None Significant Maternal Lab Results:  Group B Strep positive Number of Prenatal Visits:greater than 3 verified prenatal visits Other Comments:  None  Review of Systems  Constitutional: Negative.   HENT: Negative.    Eyes: Negative.   Respiratory: Negative.     Cardiovascular: Negative.   Gastrointestinal: Negative.   Endocrine: Negative.   Genitourinary: Negative.   Musculoskeletal: Negative.   Skin: Negative.   Allergic/Immunologic: Negative.   Neurological: Negative.   Hematological: Negative.   Psychiatric/Behavioral: Negative.     History   Blood pressure (!) 141/82, pulse 93, temperature 98.1 F (36.7 C), temperature source Oral, resp. rate 14, height  (1.651 m), weight 104.2 kg, SpO2 98 %, unknown if currently breastfeeding. Maternal Exam:  Introitus: Normal vulva.   Physical Exam Vitals reviewed.  Constitutional:      Appearance: Normal appearance.  HENT:     Head: Normocephalic and atraumatic.     Nose: Nose normal.  Cardiovascular:     Rate and Rhythm: Normal rate and regular rhythm.  Pulmonary:     Effort: Pulmonary effort is normal.     Breath sounds: Normal breath sounds.  Abdominal:     Tenderness: There is no abdominal tenderness.  Genitourinary:    General: Normal vulva.  Musculoskeletal:        General: Swelling present. Normal range of motion.     Cervical back: Normal range of motion.  Skin:    General: Skin is warm and dry.  Neurological:     General: No focal deficit present.     Mental Status: She is alert and oriented to person, place, and time.  Psychiatric:  Mood and Affect: Mood normal.        Behavior: Behavior normal.    Cervix 0/0/-3  FHR Baseline 130's moderate variability accelerations present no decelerations.  Toco irregular contractions.    Prenatal labs: ABO, Rh: O/Positive/-- (09/19 0000) Antibody:  Negative  Rubella: Immune (09/19 0000) RPR: Nonreactive (02/07 0000)  HBsAg: Negative (09/19 0000)  HIV: Non-reactive (09/19 0000)  GBS: Positive/-- (03/28 0000)   Assessment/Plan: 40 weeks and 0 days with gestational hypertension.  - Admit to labor and delivery for induction - cytotec for cervical ripening.  - plan Penicillin for GBS prophylaxis in active labor or  with ROM which ever occurs first.  - pain control - per patient preference  - Anticipate SVD  [facility] Dr. Normand Sloop Covering after 7 pm.    Gerald Leitz 11/19/2022, 4:57 PM

## 2022-11-19 NOTE — MAU Note (Signed)
Dr Normand Sloop notifed of pt's request to come off monitor or at least be out of bed. Dr Joya Gaskins would not recommend pt being off EFM at this time. Pt aware and will try to position pt in chair but still on EFM

## 2022-11-19 NOTE — Progress Notes (Signed)
Remote note BP (!) 143/84   Pulse 88   Temp 98.2 F (36.8 C)   Resp 17   Ht  (1.651 m)   Wt 104.2 kg   SpO2 98%   BMI 38.22 kg/m  Cat 1 Toco irreg Pt with gestational HTN and IOL Cytotec wil be given.    Monitor closely anticipate SVD

## 2022-11-19 NOTE — Progress Notes (Signed)
Pt had gotten back into bed and monitor cords tangled

## 2022-11-19 NOTE — Progress Notes (Signed)
To BS via w/c °

## 2022-11-19 NOTE — Progress Notes (Signed)
PT up to BR earlier

## 2022-11-19 NOTE — MAU Note (Addendum)
Pt requested to be up and about and possibly come off EFM. Dr Richardson Dopp called and left message of pt's request, asked MD to review strip and call RN back in MAU. Pt aware and agrees

## 2022-11-19 NOTE — Telephone Encounter (Signed)
Preadmission screen  

## 2022-11-19 NOTE — MAU Provider Note (Signed)
History     CSN: [id number]  Arrival date and time: 11/19/22 1410   Event Date/Time   First Provider Initiated Contact with Patient 11/19/22 1450     Chief Complaint  Patient presents with   Rupture of Membranes   HPI Patient is 42 y.o. [id number] [redacted]w[redacted]d here with complaints of Possible ROM.  Patient was evaluated at her primary OB this morning for possible ROM. AFI at the office was 8. Crist Fat was negative there. She was checked in the office closed/thick/high. She also had a BPP that was 8/8.  Dr Richardson Dopp called about patient.   +FM, denies LOF, VB, contractions, vaginal discharge.   OB History     Gravida  3   Para  2   Term  1   Preterm  1   AB  0   Living  2      SAB  0   IAB  0   Ectopic  0   Multiple      Live Births  2           Past Medical History:  Diagnosis Date   Anemia    Dysrhythmia 07/29/2013   rapid heartbeat, cardioverted twice in last three weeks using Adenosine.   Gestational diabetes    Insulin controlled gestational diabetes mellitus in third trimester    Irritable bowel syndrome    since high school   Prior preterm labor, antepartum    SVT (supraventricular tachycardia) 07/29/2014   during current pregnancy    Past Surgical History:  Procedure Laterality Date   FOOT SURGERY Left    MOUTH SURGERY  07/29/2002    Family History  Problem Relation Age of Onset   Hypertension Mother    Diabetes Mother    Hypertension Brother    Asthma Brother    Diabetes Brother    Hypertension Maternal Grandmother    Drug abuse Maternal Grandfather     Social History   Tobacco Use   Smoking status: Never   Smokeless tobacco: Never  Vaping Use   Vaping Use: Never used  Substance Use Topics   Alcohol use: No   Drug use: No    Allergies:  Allergies  Allergen Reactions   Oxycodone Itching   Latex Itching    Medications Prior to Admission  Medication Sig Dispense Refill Last Dose   diltiazem (CARDIZEM SR) 60 MG 12 hr capsule Take  1 capsule (60 mg total) by mouth 2 (two) times daily as needed for up to 30 doses (tachycardia). 30 capsule 0    Prenatal Vit-Fe Fumarate-FA (PRENATAL MULTIVITAMIN) TABS tablet Take 1 tablet by mouth daily at 12 noon.       Review of Systems  Constitutional:  Negative for chills and fever.  HENT:  Negative for congestion and sore throat.   Eyes:  Negative for pain and visual disturbance.  Respiratory:  Negative for cough, chest tightness and shortness of breath.   Cardiovascular:  Negative for chest pain.  Gastrointestinal:  Negative for abdominal pain, diarrhea, nausea and vomiting.  Endocrine: Negative for cold intolerance and heat intolerance.  Genitourinary:  Negative for dysuria and flank pain.  Musculoskeletal:  Negative for back pain.  Skin:  Negative for rash.  Allergic/Immunologic: Negative for food allergies.  Neurological:  Negative for dizziness and light-headedness.  Psychiatric/Behavioral:  Negative for agitation.    Physical Exam   Blood pressure (!) 141/82, pulse 93, temperature 98.1 F (36.7 C), temperature source Oral, resp. rate 14, height  (  1.651 m), weight 104.2 kg, SpO2 98 %, unknown if currently breastfeeding.  Patient Vitals for the past 24 hrs:  BP Temp Temp src Pulse Resp SpO2 Height Weight  11/19/22 1545 (!) 141/82 -- -- 93 -- -- -- --  11/19/22 1530 (!) 145/86 -- -- 93 -- -- -- --  11/19/22 1515 (!) 144/88 -- -- 92 -- -- -- --  11/19/22 1500 (!) 152/88 -- -- 87 -- -- -- --  11/19/22 1450 (!) 146/87 -- -- 97 -- -- -- --  11/19/22 1433 (!) 140/77 98.1 F (36.7 C) Oral 90 14 98 %  (1.651 m) 104.2 kg    Physical Exam Vitals and nursing note reviewed.  Constitutional:      General: She is not in acute distress.    Appearance: She is well-developed.     Comments: Pregnant female  HENT:     Head: Normocephalic and atraumatic.  Eyes:     General: No scleral icterus.    Conjunctiva/sclera: Conjunctivae normal.  Cardiovascular:     Rate and  Rhythm: Normal rate.  Pulmonary:     Effort: Pulmonary effort is normal.  Chest:     Chest wall: No tenderness.  Abdominal:     Palpations: Abdomen is soft.     Tenderness: There is no abdominal tenderness. There is no guarding or rebound.     Comments: Gravid  Genitourinary:    Vagina: Normal.  Musculoskeletal:        General: Normal range of motion.     Cervical back: Normal range of motion and neck supple.  Skin:    General: Skin is warm and dry.     Findings: No rash.  Neurological:     Mental Status: She is alert and oriented to person, place, and time.     MAU Course  Procedures NST  MDM: high  This patient presents to the ED for concern of   Chief Complaint  Patient presents with   Rupture of Membranes     These complains involves an extensive number of treatment options, and is a complaint that carries with it a high risk of complications and morbidity.  The differential diagnosis for  1. Elevated blood pressure INCLUDES gestational hypertension, PEC, transient spurious elevated BP  2. Leaking Fluid INCLUDES ROM, normal vaginal discharge  Co morbidities that complicate the patient evaluation:  Additional history obtained from partner  External records from outside source obtained and reviewed including Scanned media records and Prenatal care records  Lab Tests: CMP, CBC, and Urine Protein Creatinine Ratio  I ordered, and personally interpreted labs.  The pertinent results include:   Results for orders placed or performed during the hospital encounter of 11/19/22 (from the past 24 hour(s))  CBC     Status: Abnormal   Collection Time: 11/19/22  2:54 PM  Result Value Ref Range   WBC 10.5 4.0 - 10.5 K/uL   RBC 4.07 3.87 - 5.11 MIL/uL   Hemoglobin 11.0 (L) 12.0 - 15.0 g/dL   HCT 11.9 (L) 14.7 - 82.9 %   MCV 83.5 80.0 - 100.0 fL   MCH 27.0 26.0 - 34.0 pg   MCHC 32.4 30.0 - 36.0 g/dL   RDW 56.2 13.0 - 86.5 %   Platelets 174 150 - 400 K/uL   nRBC 0.0 0.0 -  0.2 %  Comprehensive metabolic panel     Status: Abnormal   Collection Time: 11/19/22  2:54 PM  Result Value Ref Range  Sodium 134 (L) 135 - 145 mmol/L   Potassium 4.2 3.5 - 5.1 mmol/L   Chloride 101 98 - 111 mmol/L   CO2 23 22 - 32 mmol/L   Glucose, Bld 89 70 - 99 mg/dL   BUN <5 (L) 6 - 20 mg/dL   Creatinine, Ser 6.04 0.44 - 1.00 mg/dL   Calcium 9.4 8.9 - 54.0 mg/dL   Total Protein 6.8 6.5 - 8.1 g/dL   Albumin 3.0 (L) 3.5 - 5.0 g/dL   AST 30 15 - 41 U/L   ALT 23 0 - 44 U/L   Alkaline Phosphatase 135 (H) 38 - 126 U/L   Total Bilirubin 0.4 0.3 - 1.2 mg/dL   GFR, Estimated >98 >11 mL/min   Anion gap 10 5 - 15  Amnisure rupture of membrane (rom)not at [facility]     Status: None   Collection Time: 11/19/22  3:03 PM  Result Value Ref Range   Amnisure ROM NEGATIVE   Protein / creatinine ratio, urine     Status: Abnormal   Collection Time: 11/19/22  3:51 PM  Result Value Ref Range   Creatinine, Urine 83 mg/dL   Total Protein, Urine 14 mg/dL   Protein Creatinine Ratio 0.17 (H) 0.00 - 0.15 mg/mg[Cre]    Imaging Studies ordered:None  Medicines ordered and prescription drug management:  Medications:  None   Reevaluation of the patient after these medicines showed that the patient improved I have reviewed the patients home medicines and have made adjustments as needed   MAU Course: 5:22 PM reviewed labs with patient and partner. With elevated BP I recommend admission for gHTN, mild range BP currently.   After the interventions noted above, I reevaluated the patient and found that they have : Stable  Dispostion: admitted to the hospital   Assessment and Plan   1. Gestational hypertension, third trimester   2. [redacted] weeks gestation of pregnancy    Admit to LD Discussed with Dr Richardson Dopp my recommendation for admission Patient agrees to admission  Federico Flake 11/19/2022, 5:22 PM

## 2022-11-20 ENCOUNTER — Inpatient Hospital Stay (HOSPITAL_BASED_OUTPATIENT_CLINIC_OR_DEPARTMENT_OTHER): Payer: Managed Care, Other (non HMO)

## 2022-11-20 ENCOUNTER — Encounter (HOSPITAL_COMMUNITY): Payer: Self-pay | Admitting: Obstetrics and Gynecology

## 2022-11-20 ENCOUNTER — Other Ambulatory Visit: Payer: Self-pay

## 2022-11-20 DIAGNOSIS — Z3A40 40 weeks gestation of pregnancy: Secondary | ICD-10-CM

## 2022-11-20 DIAGNOSIS — O133 Gestational [pregnancy-induced] hypertension without significant proteinuria, third trimester: Secondary | ICD-10-CM | POA: Diagnosis not present

## 2022-11-20 LAB — RPR: RPR Ser Ql: NONREACTIVE

## 2022-11-20 MED ORDER — LIDOCAINE HCL (PF) 1 % IJ SOLN
INTRAMUSCULAR | Status: AC
  Administered 2022-11-20: 30 mL via SUBCUTANEOUS
  Filled 2022-11-20: qty 30

## 2022-11-20 MED ORDER — SIMETHICONE 80 MG PO CHEW: 80.0000 mg | CHEWABLE_TABLET | ORAL | Status: DC | PRN

## 2022-11-20 MED ORDER — BENZOCAINE-MENTHOL 20-0.5 % EX AERO
1.0000 | INHALATION_SPRAY | CUTANEOUS | Status: DC | PRN
  Filled 2022-11-20: qty 56

## 2022-11-20 MED ORDER — DIBUCAINE (PERIANAL) 1 % EX OINT
1.0000 | TOPICAL_OINTMENT | CUTANEOUS | Status: DC | PRN
  Filled 2022-11-20: qty 28

## 2022-11-20 MED ORDER — PRENATAL MULTIVITAMIN CH
1.0000 | ORAL_TABLET | Freq: Every day | ORAL | Status: DC
  Filled 2022-11-20: qty 1

## 2022-11-20 MED ORDER — MISOPROSTOL 200 MCG PO TABS
ORAL_TABLET | ORAL | Status: AC
  Filled 2022-11-20: qty 5

## 2022-11-20 MED ORDER — ONDANSETRON HCL 4 MG PO TABS: 4.0000 mg | ORAL_TABLET | ORAL | Status: DC | PRN

## 2022-11-20 MED ORDER — ZOLPIDEM TARTRATE 5 MG PO TABS: 5.0000 mg | ORAL_TABLET | Freq: Every evening | ORAL | Status: DC | PRN

## 2022-11-20 MED ORDER — DILTIAZEM HCL ER 60 MG PO CP12: 60.0000 mg | ORAL_CAPSULE | Freq: Two times a day (BID) | ORAL | Status: DC | PRN

## 2022-11-20 MED ORDER — DIPHENHYDRAMINE HCL 25 MG PO CAPS: 25.0000 mg | ORAL_CAPSULE | Freq: Four times a day (QID) | ORAL | Status: DC | PRN

## 2022-11-20 MED ORDER — ACETAMINOPHEN 325 MG PO TABS
650.0000 mg | ORAL_TABLET | ORAL | Status: DC | PRN
  Administered 2022-11-20 – 2022-11-21 (×2): 162.5 mg via ORAL
  Filled 2022-11-20 (×2): qty 2

## 2022-11-20 MED ORDER — MISOPROSTOL 200 MCG PO TABS
ORAL_TABLET | ORAL | Status: AC
  Filled 2022-11-20: qty 1

## 2022-11-20 MED ORDER — WITCH HAZEL-GLYCERIN EX PADS: 1.0000 | MEDICATED_PAD | CUTANEOUS | Status: DC | PRN

## 2022-11-20 MED ORDER — IBUPROFEN 600 MG PO TABS
600.0000 mg | ORAL_TABLET | Freq: Four times a day (QID) | ORAL | Status: DC
  Filled 2022-11-20: qty 1

## 2022-11-20 MED ORDER — FERROUS SULFATE 325 (65 FE) MG PO TABS
325.0000 mg | ORAL_TABLET | Freq: Two times a day (BID) | ORAL | Status: DC
  Filled 2022-11-20: qty 1

## 2022-11-20 MED ORDER — SENNOSIDES-DOCUSATE SODIUM 8.6-50 MG PO TABS
2.0000 | ORAL_TABLET | Freq: Every day | ORAL | Status: DC
  Administered 2022-11-20: 1 via ORAL
  Filled 2022-11-20 (×2): qty 2

## 2022-11-20 MED ORDER — COCONUT OIL OIL: 1.0000 | TOPICAL_OIL | Status: DC | PRN

## 2022-11-20 MED ORDER — MISOPROSTOL 200 MCG PO TABS
1000.0000 ug | ORAL_TABLET | Freq: Once | ORAL | Status: AC
  Administered 2022-11-20: 1000 ug via RECTAL

## 2022-11-20 MED ORDER — TRANEXAMIC ACID-NACL 1000-0.7 MG/100ML-% IV SOLN
INTRAVENOUS | Status: AC
  Administered 2022-11-20: 1000 mg
  Filled 2022-11-20: qty 100

## 2022-11-20 MED ORDER — ONDANSETRON HCL 4 MG/2ML IJ SOLN: 4.0000 mg | INTRAMUSCULAR | Status: DC | PRN

## 2022-11-20 NOTE — Progress Notes (Signed)
Dr. Richardson Dopp called to verify when to give Cardizem prn. See care order for details. Dr. Richardson Dopp updated on heart rate and all blood pressure values and lochia status.

## 2022-11-20 NOTE — Progress Notes (Signed)
  Subjective: Patient rates her contractions as 6 out of 10. No lof no vaginal bleeding. +FM she denies headache visual disturbances or ruq pain.   Objective: BP 139/83   Pulse 89   Temp 97.6 F (36.4 C) (Oral)   Resp 16   Ht  (1.651 m)   Wt 104.2 kg   SpO2 98%   BMI 38.22 kg/m  No intake/output data recorded. No intake/output data recorded.  FHT:  FHR: 130 bpm, variability: moderate,  accelerations:  Present,  decelerations:  Present variable UC:   irregular, every 2-6 minutes SVE:   Dilation: 4 Effacement (%): 50 Station: Ballotable Exam by:: Dr. Richardson Dopp  Labs: Lab Results  Component Value Date   WBC 10.5 11/19/2022   HGB 11.0 (L) 11/19/2022   HCT 34.0 (L) 11/19/2022   MCV 83.5 11/19/2022   PLT 174 11/19/2022    Assessment / Plan: Induction of labor due to gestational hypertension   Labor: Progressing normally s/p Cytotec 25 mcg po and PV.. plan AROM with fetal descent.  Preeclampsia:   NA Fetal Wellbeing:  Category I and Category II overall reassuring  Pain Control:  Labor support without medications I/D:   Penicillin Anticipated MOD:  NSVD  Gerald Leitz, MD 11/20/2022, 10:08 AM

## 2022-11-21 LAB — CBC
HCT: 29.2 % — ABNORMAL LOW (ref 36.0–46.0)
Hemoglobin: 9.7 g/dL — ABNORMAL LOW (ref 12.0–15.0)
MCH: 27.2 pg (ref 26.0–34.0)
MCHC: 33.2 g/dL (ref 30.0–36.0)
MCV: 82 fL (ref 80.0–100.0)
Platelets: 164 10*3/uL (ref 150–400)
RBC: 3.56 MIL/uL — ABNORMAL LOW (ref 3.87–5.11)
RDW: 14.6 % (ref 11.5–15.5)
WBC: 13.3 10*3/uL — ABNORMAL HIGH (ref 4.0–10.5)
nRBC: 0 % (ref 0.0–0.2)

## 2022-11-21 MED ORDER — ACETAMINOPHEN 325 MG PO TABS: 650.0000 mg | ORAL_TABLET | Freq: Four times a day (QID) | ORAL | Status: AC | PRN

## 2022-11-21 MED ORDER — IBUPROFEN 600 MG PO TABS: 600.0000 mg | ORAL_TABLET | Freq: Four times a day (QID) | ORAL | 1 refills | Status: DC | PRN

## 2022-11-21 NOTE — Progress Notes (Signed)
Post Partum Day 1 Subjective: no complaints, up ad lib, voiding, and tolerating PO  Objective: Blood pressure 125/76, pulse (!) 105, temperature 98 F (36.7 C), temperature source Oral, resp. rate 19, height  (1.651 m), weight 104.2 kg, SpO2 100 %, unknown if currently breastfeeding.  Physical Exam:  General: alert, cooperative, and no distress Lochia: appropriate Uterine Fundus: firm Incision: NA DVT Evaluation: No evidence of DVT seen on physical exam.  Recent Labs    11/19/22 1454 11/21/22 0516  HGB 11.0* 9.7*  HCT 34.0* 29.2*    Assessment/Plan: Plan for discharge tomorrow, Breastfeeding, and Lactation consult Gestational hypertension - bp well controlled without medication at this time . Will continue to monitor     LOS: 2 days   Gerald Leitz, MD 11/21/2022, 9:12 AM

## 2022-11-21 NOTE — Discharge Instructions (Signed)
WHAT TO LOOK OUT FOR: Fever of 100.4 or above Mastitis: feels like flu and breasts hurt Infection: increased pain, swelling or redness Blood clots golf ball size or larger Postpartum depression   Congratulations on your newest addition! 

## 2022-11-21 NOTE — Progress Notes (Signed)
Went back to check on pt. Pt in bathroom. Pt states she feels much better. I listened to her and HR was 105 pt does states she's been up ambulating and believes that's why her HR is up but that she feels good. I will continue to monitor.

## 2022-11-21 NOTE — Progress Notes (Signed)
Pt called out stating she feels her heart rate is up and broke out into a sweat. I went in and put pulse ox on finger, her heart rate was 116 and dropped down to 104 quickly after. I got an apical pulse to verify and I got 105. I stayed with patient and  monitored heart rate and it was between 101-115 never got above 120. Informed mom that I have orders to give cardizem if her heart rate hits 120 or above. I will come back in 30 more minutes to reassess. Pt states that she's nervous and anxious because baby has been very fussy. I reassured mom and offered assistance. I informed her that the stress could be causing her heart rate to increase and encouraged her to try to relax. I offered to decrease room temp since they have it set on 76 and it is very warm in there. Pt and pt's mom declined. I brought mom fan to help her cool off. I also educated her that the hot flashes could be hormonal changes going on in her body.

## 2022-11-21 NOTE — Lactation Note (Signed)
This note was copied from a baby's chart. Lactation Consultation Note  Patient Name: Amber Davenport Date: 11/21/2022 Ephriam Knuckles delivered and mom called to have it set up .  LC set it and checked the flange side and the #24 F was a good fit for now and recommended to order a #21 F online  Interventions  Education for her DEBP    Consult Status  Follow - up for tomorrow.  11/22/2022     Amber Davenport 11/21/2022, 3:37 PM

## 2022-11-21 NOTE — Discharge Summary (Signed)
Postpartum Discharge Summary  Date of Service updated 11/21/2022     Patient Name: Amber Davenport DOB: January 15, 1981 MRN: [id number]  Date of admission: 11/19/2022 Delivery date:11/20/2022  Delivering provider: Gerald Leitz  Date of discharge: 11/21/2022  Admitting diagnosis: Gestational hypertension [O13.9] Intrauterine pregnancy: [redacted]w[redacted]d     Secondary diagnosis:  Principal Problem:   Gestational hypertension  Additional problems: None    Discharge diagnosis: Term Pregnancy Delivered, Gestational Hypertension, and PPH                                              Post partum procedures: NOne Augmentation: Cytotec Complications: None  Hospital course: Induction of Labor With Vaginal Delivery   42 y.o. yo [id number] at [redacted]w[redacted]d was admitted to the hospital 11/19/2022 for induction of labor.  Indication for induction: Gestational hypertension.  Patient had an labor course complicated by postpartum hemorrhage Membrane Rupture Time/Date: 10:12 AM ,11/20/2022   Delivery Method:Vaginal, Spontaneous  Episiotomy: None  Lacerations:  2nd degree  Details of delivery can be found in separate delivery note.  Patient had a postpartum course complicated by postpartum hemorrhage . Patient is discharged home 11/21/22.  Newborn Data: Birth date:11/20/2022  Birth time:10:55 AM  Gender:Female  Living status:Living  Apgars:9 ,9  Weight:3520 g   Magnesium Sulfate received: No BMZ received: No Rhophylac:N/A MMR:N/A T-DaP:Given prenatally Flu: N/A Transfusion:No  Physical exam  Vitals:   11/21/22 0421 11/21/22 0422 11/21/22 0423 11/21/22 0500  BP:      Pulse: (!) 104 (!) 105 (!) 101 (!) 105  Resp:      Temp:      TempSrc:      SpO2: 100%     Weight:      Height:       General: alert, cooperative, and no distress Lochia: appropriate Uterine Fundus: firm Incision: N/A DVT Evaluation: No evidence of DVT seen on physical exam. Labs: Lab Results  Component Value Date   WBC 13.3 (H)  11/21/2022   HGB 9.7 (L) 11/21/2022   HCT 29.2 (L) 11/21/2022   MCV 82.0 11/21/2022   PLT 164 11/21/2022      Latest Ref Rng & Units 11/19/2022    2:54 PM  CMP  Glucose 70 - 99 mg/dL 89   BUN 6 - 20 mg/dL <5   Creatinine 1.47 - 1.00 mg/dL 8.29   Sodium 562 - 130 mmol/L 134   Potassium 3.5 - 5.1 mmol/L 4.2   Chloride 98 - 111 mmol/L 101   CO2 22 - 32 mmol/L 23   Calcium 8.9 - 10.3 mg/dL 9.4   Total Protein 6.5 - 8.1 g/dL 6.8   Total Bilirubin 0.3 - 1.2 mg/dL 0.4   Alkaline Phos 38 - 126 U/L 135   AST 15 - 41 U/L 30   ALT 0 - 44 U/L 23    Edinburgh Score:    11/20/2022    2:00 PM  Edinburgh Postnatal Depression Scale Screening Tool  I have been able to laugh and see the funny side of things. 0  I have looked forward with enjoyment to things. 0  I have blamed myself unnecessarily when things went wrong. 1  I have been anxious or worried for no good reason. 1  I have felt scared or panicky for no good reason. 0  Things have been getting on top of me. 0  I have been so unhappy that I have had difficulty sleeping. 0  I have felt sad or miserable. 0  I have been so unhappy that I have been crying. 0  The thought of harming myself has occurred to me. 0  Edinburgh Postnatal Depression Scale Total 2      After visit meds:  Allergies as of 11/21/2022       Reactions   Oxycodone Itching   Latex Itching        Medication List     STOP taking these medications    diltiazem 60 MG 12 hr capsule Commonly known as: CARDIZEM SR       TAKE these medications    acetaminophen 325 MG tablet Commonly known as: Tylenol Take 2 tablets (650 mg total) by mouth every 6 (six) hours as needed for mild pain or moderate pain (for pain scale < 4).   ibuprofen 600 MG tablet Commonly known as: ADVIL Take 1 tablet (600 mg total) by mouth every 6 (six) hours as needed.   prenatal multivitamin Tabs tablet Take 2 tablets by mouth daily at 12 noon.   TUMS PO Take 1 tablet by mouth  as needed (reflux).         Discharge home in stable condition Infant Feeding: Bottle and Breast Infant Disposition:home with mother Discharge instruction: per After Visit Summary and Postpartum booklet. Activity: Advance as tolerated. Pelvic rest for 6 weeks.  Diet: low salt diet Anticipated Birth Control: Unsure Postpartum Appointment:1 week Additional Postpartum F/U: BP check 1 week Future Appointments: Future Appointments  Date Time Provider Department Center  01/02/2023  2:00 PM Tessa Lerner, DO [facility] None   Follow up Visit:  Follow-up Information     Gerald Leitz, MD. Schedule an appointment as soon as possible for a visit in 1 week(s).   Specialty: Obstetrics and Gynecology Why: please schedule an appointment in 1 week for nurse visit for blood pressure check Contact information: [address] [place] [address] [phone]                     11/21/2022 Gerald Leitz, MD

## 2022-11-21 NOTE — Lactation Note (Signed)
This note was copied from a baby's chart. Lactation Consultation Note  Patient Name: Girl Aijalon Demuro [id number] Date: 11/21/2022 Age:40 hours Reason for consult: Initial assessment;Exclusive pumping and bottle feeding;Term LC reviewed supply and demand and importance of consistent pumping around the clock.   Maternal Data Does the patient have breastfeeding experience prior to this delivery?: Yes How long did the patient breastfeed?: per mom pumped x 6 months with 1st and 2nd baby  Feeding Mother's Current Feeding Choice: Breast Milk and Formula   Lactation Tools Discussed/Used Tools:  (mom using her hand free DEBP and no EBM yield as of yet. LC ordered mom a DEBP Spectra throught [facility] and recommended switching to establish supply)  Interventions  Education   Discharge Pump: Hands Free;Personal  Consult Status Consult Status: Follow-up Date: 11/22/22 Follow-up type: In-patient    Matilde Sprang Kamaile Zachow 11/21/2022, 9:30 AM

## 2022-11-21 NOTE — Progress Notes (Signed)
CSW received a consult for anxiety. CSW met Amber Davenport at bedside to complete mental health assessment. CSW entered room, introduced herself and acknowledged that guest were present. Amber Davenport gave CSW verbal permission to speak about anything personal while guest were present. CSW explained her role and the reason for the visit. Amber Davenport was polite, easy to engage, receptive to meeting with CSW, and appeared forthcoming.  CSW inquired about Amber Davenport's mental health history. Amber Davenport reported being diagnosed with anxiety (2020-2021); symptoms included panicking, "thinking she would die" and heavy breathing. Amber Davenport reported being prescribed medications, but not remembering the names. Amber Davenport reported no longer taking the medications; however she is participating in therapy with her therapist "Tiffany", and a nurse that is apart of her job, for support. Amber Davenport reported utilizing coping skills that included praying , listening to calming music and stress balls. Amber Davenport reported a large support system that included family, friends and FOB. CSW provided education regarding the baby blues period vs. perinatal mood disorders, discussed treatment and gave resources for mental health follow up if concerns arise.  CSW recommends self-evaluation during the postpartum time period using the New Mom Checklist from Postpartum Progress and encouraged Amber Davenport to contact a medical professional if symptoms are noted at any time. CSW assessed for safety with Amber Davenport SI/HI/DV;Amber Davenport denied all.  CSW asked Amber Davenport has she chosen a pediatrician for infant to attend follow up visits; Amber Davenport said [facility]. Amber Davenport reported having all essential needs for infant including a car seat and bassinet for safe sleeping.  CSW identifies no further need for intervention and no barriers to discharge at this time.   Enos Fling, Theresia Majors Clinical Social Worker [phone]

## 2022-11-26 ENCOUNTER — Inpatient Hospital Stay (HOSPITAL_COMMUNITY)
Admission: RE | Admit: 2022-11-26 | Payer: Managed Care, Other (non HMO) | Source: Home / Self Care | Admitting: Obstetrics and Gynecology

## 2022-11-26 ENCOUNTER — Inpatient Hospital Stay (HOSPITAL_COMMUNITY): Payer: Managed Care, Other (non HMO)

## 2022-11-30 ENCOUNTER — Telehealth (HOSPITAL_COMMUNITY): Payer: Self-pay

## 2022-11-30 NOTE — Telephone Encounter (Signed)
Patient reports feeling good. Patient states that she has been having a headache for 2 days. RN reviewed preeclampsia and warning signs. Patient states that she is just having the headache. She states that she has been checking her BP and that it has been ok at home. She states that the inside of her left lower leg is having some pain, skin is not warm to touch. RN told patient about [facility] and recommended that she be evaluated for her headache and lower leg pain. Patient declines any other questions/concerns about her health and healing.  Patient reports that baby is doing great. Eating well and gaining weight well. Baby sleeps in a bassinet. RN reviewed ABC's of safe sleep with patient. Patient declines any questions or concerns about baby.  EPDS- Patient declines wanting to do EPDS at this time.  Suann Larry [facility] [employer]   11/30/22,1411

## 2023-01-02 ENCOUNTER — Encounter: Payer: Self-pay | Admitting: Cardiology

## 2023-01-02 ENCOUNTER — Ambulatory Visit: Payer: Managed Care, Other (non HMO) | Admitting: Cardiology

## 2023-01-02 VITALS — BP 135/89 | HR 80 | Ht 65.0 in | Wt 213.0 lb

## 2023-01-02 DIAGNOSIS — I471 Supraventricular tachycardia, unspecified: Secondary | ICD-10-CM

## 2023-01-02 DIAGNOSIS — R002 Palpitations: Secondary | ICD-10-CM

## 2023-01-02 NOTE — Progress Notes (Signed)
Date:  01/02/2023   ID:  Amber Davenport, DOB 1980-08-06, MRN [id number]  PCP:  Lorenda Ishihara, MD  Cardiologist:  Tessa Lerner, DO, [facility] (established care 03/20/2021) Former Cardiology Providers: Thurmon Fair, MD  Date: 01/02/23 Last Office Visit: 07/30/2022  Chief Complaint  Patient presents with   Palpitations   Follow-up    HPI  Amber Davenport is a 42 y.o. female whose past medical history and cardiovascular risk factors include: hypertension, supraventricular tachycardia, history of COVID-19 infection (May 2022), gestational diabetes, postpartum depression, anxiety, panic attacks.  Patient is being followed by the practice for palpitations.  In the past she has had episodes of paroxysmal supraventricular tachycardia responsive to adenosine/Valsalva maneuver.  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  Her PSVT episode was first noted at the end of her first pregnancy and since then has been on diltiazem on appearing basis if needed.  During her second pregnancy she had another episode of PSVT which did not respond to diltiazem or Valsalva maneuver.  In April 2024 she went to the ED and was administered adenosine x 1 which converted her to normal rhythm.  Thereafter her pregnancy was unremarkable.  She gave birth to a baby girl via vaginal delivery in April 2024.  Post delivery patient has not had any cardiac arrhythmias and clinically is doing well.  Home blood pressures are between 120-130 mmHg according to the patient.  FUNCTIONAL STATUS: Works out a day - cross fit.     ALLERGIES: Allergies  Allergen Reactions   Oxycodone Itching   Latex Itching    MEDICATION LIST PRIOR TO VISIT: Current Meds  Medication Sig   acetaminophen (TYLENOL) 325 MG tablet Take 2 tablets (650 mg total) by mouth every 6 (six) hours as needed for mild pain or moderate pain (for pain scale < 4).   Calcium Carbonate Antacid (TUMS PO) Take 1 tablet by  mouth as needed (reflux).   diltiazem (CARDIZEM) 60 MG tablet Take 60 mg by mouth 4 (four) times daily as needed (for palpitations).   Prenatal Vit-Fe Fumarate-FA (PRENATAL MULTIVITAMIN) TABS tablet Take 2 tablets by mouth daily at 12 noon.     PAST MEDICAL HISTORY: Past Medical History:  Diagnosis Date   Anemia    Dysrhythmia 07/29/2013   rapid heartbeat, cardioverted twice in last three weeks using Adenosine.   Gestational diabetes    Insulin controlled gestational diabetes mellitus in third trimester    Irritable bowel syndrome    since high school   Prior preterm labor, antepartum    SVT (supraventricular tachycardia) 07/29/2014   during current pregnancy    PAST SURGICAL HISTORY: Past Surgical History:  Procedure Laterality Date   FOOT SURGERY Left    MOUTH SURGERY  07/29/2002    FAMILY HISTORY: The patient family history includes Asthma in her brother; Diabetes in her brother, maternal grandfather, and mother; Hypertension in her brother, maternal grandmother, and mother.  SOCIAL HISTORY:  The patient  reports that she has never smoked. She has never used smokeless tobacco. She reports that she does not drink alcohol and does not use drugs.  REVIEW OF SYSTEMS: Review of Systems  Cardiovascular:  Positive for palpitations (Rare, last episode April 2024). Negative for chest pain, claudication, dyspnea on exertion, irregular heartbeat, leg swelling, near-syncope, orthopnea, paroxysmal nocturnal dyspnea and syncope.  Respiratory:  Negative for shortness of breath.   Hematologic/Lymphatic: Negative for bleeding problem.  Musculoskeletal:  Negative for muscle cramps and myalgias.  Neurological:  Negative for dizziness and light-headedness.   PHYSICAL EXAM:    01/02/2023    2:45 PM 01/02/2023    2:12 PM 01/02/2023    2:08 PM  Vitals with BMI  Height   5\' 5"   Weight   213 lbs  BMI   35.44  Systolic 135 177 161  Diastolic 89 110 119  Pulse 80  76   Physical Exam   Constitutional: No distress.  Age appropriate, hemodynamically stable.   Neck: No JVD present.  Cardiovascular: Normal rate, regular rhythm, S1 normal, S2 normal, intact distal pulses and normal pulses. Exam reveals no gallop, no S3 and no S4.  No murmur heard. Pulmonary/Chest: Effort normal and breath sounds normal. No stridor. She has no wheezes. She has no rales.  Abdominal: Soft. Bowel sounds are normal. She exhibits no distension. There is no abdominal tenderness.  Musculoskeletal:        General: No edema.     Cervical back: Neck supple.  Neurological: She is alert and oriented to person, place, and time. She has intact cranial nerves (2-12).  Skin: Skin is warm and moist.   CARDIAC DATABASE: EKG: 03/20/2021: Sinus tachycardia, 101 bpm, short PR interval, nonspecific T wave abnormality, without underlying injury pattern  07/30/2022: Sinus tachycardia, 104 bpm, nonspecific T wave abnormality.  January 02, 2023: Sinus rhythm, 73 bpm, nonspecific T wave abnormality.  Compared to last EKG 07/30/2022 heart rate has reduced.  Echocardiogram: 03/28/2021:  Left ventricle cavity is normal in size and wall thickness. Normal global wall motion. Normal LV systolic function with EF 70%. Normal diastolic filling pattern.  No significant valvular abnormality.  Normal right atrial pressure.   Stress Testing: No results found for this or any previous visit from the past 1095 days.  Heart Catheterization: None  RADIOLOGY: CT PE protocol.  02/28/2021: Normal CTA of the chest.  No evidence of pulmonary embolism or other acute findings.  LABORATORY DATA:    Latest Ref Rng & Units 11/21/2022    5:16 AM 11/19/2022    2:54 PM 11/04/2022    7:51 PM  CBC  WBC 4.0 - 10.5 K/uL 13.3  10.5  9.8   Hemoglobin 12.0 - 15.0 g/dL 9.7  09.6  9.7   Hematocrit 36.0 - 46.0 % 29.2  34.0  30.2   Platelets 150 - 400 K/uL 164  174  146        Latest Ref Rng & Units 11/19/2022    2:54 PM 11/04/2022    7:51 PM 09/11/2014     5:25 PM  CMP  Glucose 70 - 99 mg/dL 89  98  82   BUN 6 - 20 mg/dL 5  6  6    Creatinine 0.44 - 1.00 mg/dL 0.45  4.09  8.11   Sodium 135 - 145 mmol/L 134  137  137   Potassium 3.5 - 5.1 mmol/L 4.2  3.6  3.8   Chloride 98 - 111 mmol/L 101  108  110   CO2 22 - 32 mmol/L 23  22  23    Calcium 8.9 - 10.3 mg/dL 9.4  8.1  9.2   Total Protein 6.5 - 8.1 g/dL 6.8   5.8   Total Bilirubin 0.3 - 1.2 mg/dL 0.4   0.4   Alkaline Phos 38 - 126 U/L 135   84   AST 15 - 41 U/L 30   37   ALT 0 - 44 U/L 23   30     Lipid  Panel  No results found for: "CHOL", "TRIG", "HDL", "CHOLHDL", "VLDL", "LDLCALC", "LDLDIRECT", "LABVLDL"  No components found for: "NTPROBNP" No results for input(s): "PROBNP" in the last 8760 hours. No results for input(s): "TSH" in the last 8760 hours.  BMP Recent Labs    11/04/22 1951 11/19/22 1454  NA 137 134*  K 3.6 4.2  CL 108 101  CO2 22 23  GLUCOSE 98 89  BUN 6 <5*  CREATININE 0.52 0.62  CALCIUM 8.1* 9.4  GFRNONAA >60 >60    HEMOGLOBIN A1C No results found for: "HGBA1C", "MPG"  External Labs: Collected: 02/27/2021 D-dimer 0.77. Troponin T: Less than 6. TSH 1.02.  IMPRESSION:    ICD-10-CM   1. Palpitations  R00.2 EKG 12-Lead    2. SVT (supraventricular tachycardia)  I47.10        RECOMMENDATIONS: Yakira Hannum is a 42 y.o. female whose past medical history and cardiac risk factors include: hypertension, supraventricular tachycardia, history of COVID-19 infection (May 2022), gestational diabetes, postpartum depression, anxiety, panic attacks.  Patient is being followed by the practice for palpitations/PSVT.  Prior EKGs have noted the possible underlying rhythm to be AV nodal reentry.  In the past her PSVT was being managed by either Cardizem or Valsalva maneuver.  Her first episode of SVT was at the end of her first pregnancy and the second episode was in April 2024 during her second pregnancy.  She required adenosine to convert her to normal  sinus rhythm.  She delivered a baby girl via vaginal delivery in April 2024 without any cardiovascular complications or dysrhythmias.  Thereafter she has done well and currently is asymptomatic.  She is has been requiring adenosine to terminate her SVT recommended consultation with electrophysiology to see if she would be a candidate for catheter directed ablation.  Patient states that she will discuss this further with her husband would like to hold off on the consultation.  Has not experienced near-syncope or syncopal events.  No family history of premature coronary artery disease or sudden cardiac death.  She has diltiazem to use on a as needed basis.  She is aware of Valsalva maneuvers to help mitigate the symptoms.  No additional cardiovascular testing warranted at this time.  Will follow-up on a yearly basis sooner if needed.  FINAL MEDICATION LIST END OF ENCOUNTER: No orders of the defined types were placed in this encounter.    Current Outpatient Medications:    acetaminophen (TYLENOL) 325 MG tablet, Take 2 tablets (650 mg total) by mouth every 6 (six) hours as needed for mild pain or moderate pain (for pain scale < 4)., Disp: , Rfl:    Calcium Carbonate Antacid (TUMS PO), Take 1 tablet by mouth as needed (reflux)., Disp: , Rfl:    diltiazem (CARDIZEM) 60 MG tablet, Take 60 mg by mouth 4 (four) times daily as needed (for palpitations)., Disp: , Rfl:    Prenatal Vit-Fe Fumarate-FA (PRENATAL MULTIVITAMIN) TABS tablet, Take 2 tablets by mouth daily at 12 noon., Disp: , Rfl:   Orders Placed This Encounter  Procedures   EKG 12-Lead    There are no Patient Instructions on file for this visit.   --Continue cardiac medications as reconciled in final medication list. --Return in about 1 year (around 01/02/2024) for Follow up hx of SVT. Or sooner if needed. --Continue follow-up with your primary care physician regarding the management of your other chronic comorbid  conditions.  Patient's questions and concerns were addressed to her satisfaction. She voices understanding of the  instructions provided during this encounter.   This note was created using a voice recognition software as a result there may be grammatical errors inadvertently enclosed that do not reflect the nature of this encounter. Every attempt is made to correct such errors.  Tessa Lerner, [place], [facility]  Pager:  [phone] Office: [phone]

## 2023-01-29 ENCOUNTER — Telehealth: Payer: Self-pay

## 2023-01-29 NOTE — Telephone Encounter (Signed)
Patient called and stated that she was prescribed Losartan by her OBGYN  Dr. Richardson Dopp. She wants to know if this medication will raise her heartrate. Please advise.

## 2023-01-29 NOTE — Telephone Encounter (Signed)
Losartan should not raise the HR.  Amber Davenport [place], DO, [facility]

## 2023-02-10 NOTE — Telephone Encounter (Signed)
That's fine.   Amber Woodhams [place], DO, [facility]

## 2023-02-10 NOTE — Telephone Encounter (Signed)
Patient is aware. She mentioned that her PCP may make an increase from 25mg  to 50mg . Just FYI.

## 2023-04-16 NOTE — Telephone Encounter (Signed)
This encounter was created in error - please disregard.

## 2023-04-25 IMAGING — CT CT ANGIO CHEST
2 of 8 series · 10 of 36 positions shown · IV contrast (iopamidol)
Comparison: None.

CLINICAL DATA: Chest pressure, lower extremity edema, recent foot
surgery and elevated D-dimer.

EXAM:
CT ANGIOGRAPHY CHEST WITH CONTRAST
TECHNIQUE: Multidetector CT imaging of the chest was performed using the
standard protocol during bolus administration of intravenous
contrast. Multiplanar CT image reconstructions and MIPs were
obtained to evaluate the vascular anatomy.
CONTRAST:  75mL 6IAUGN-NV5 IOPAMIDOL (6IAUGN-NV5) INJECTION 76%

[Series 7: cta pulmonary 2.00 bv36 s3 · coronal · 0.58mm/px · 1 of 131 slices shown]
[im 66/131  mediastinal]
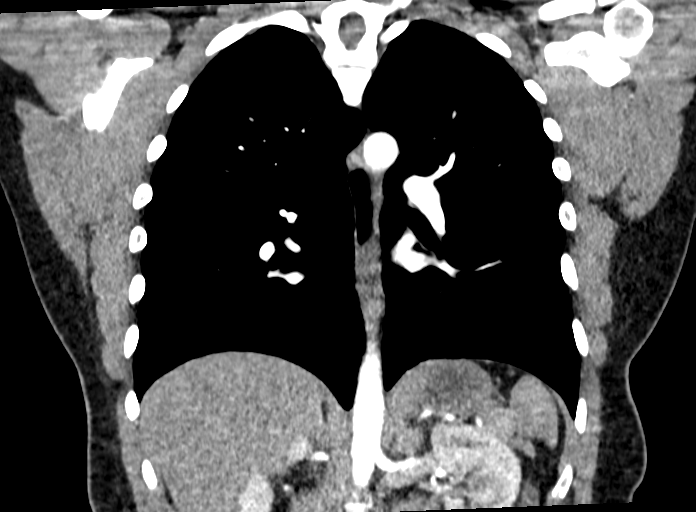

[Series 12: cta pulmonary 1.00 bv36 s3 super d. · axial · 0.79mm/px · z∈[+1575,+1812]mm · 9 of 372 slices shown]
[im 38/372  lung]
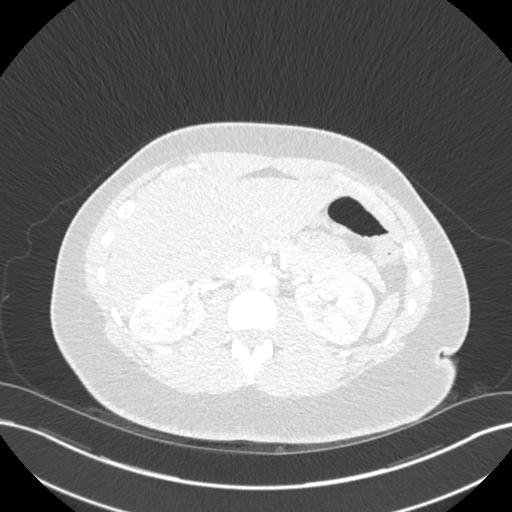
[im 75/372  mediastinal]
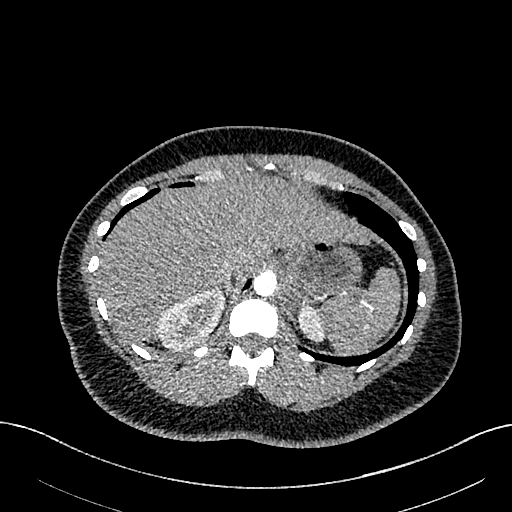
[im 112/372  lung]
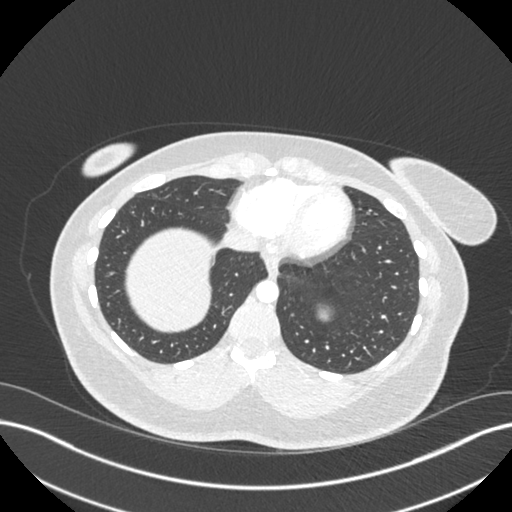
[im 149/372  mediastinal]
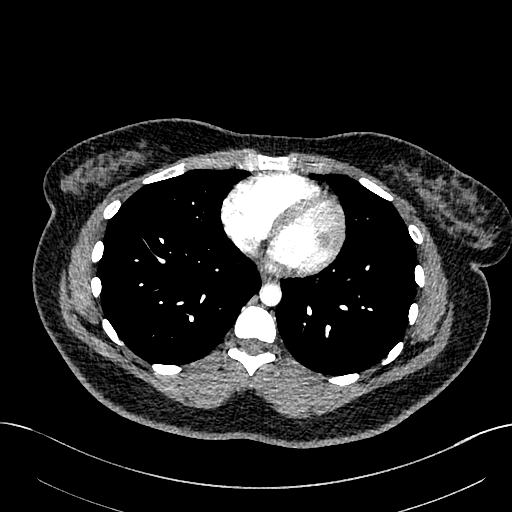
[im 186/372  lung]
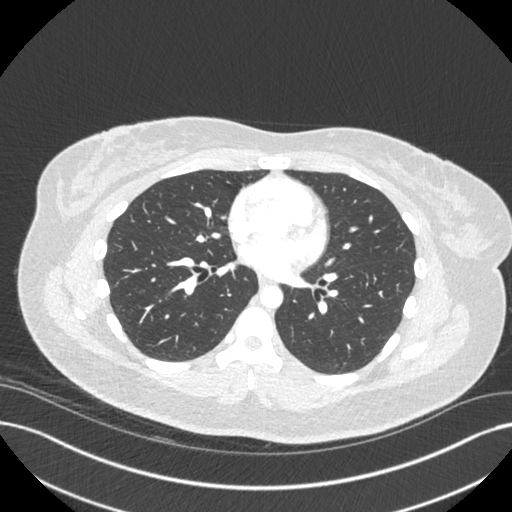
[im 223/372  mediastinal]
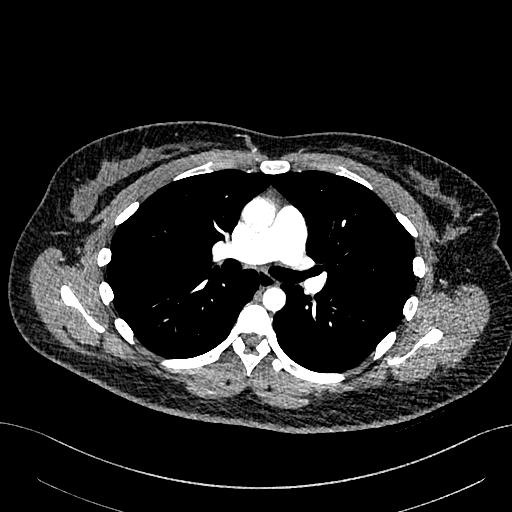
[im 260/372  lung]
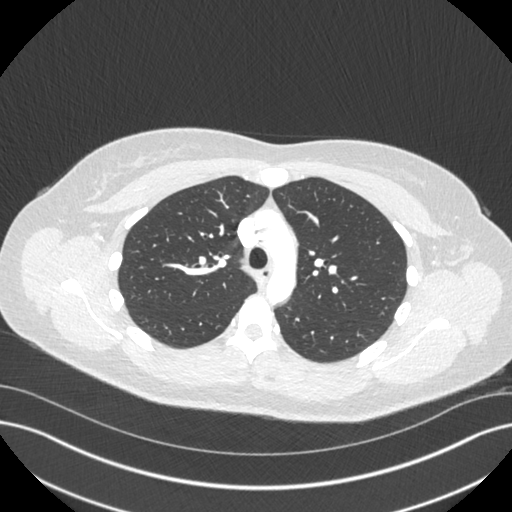
[im 297/372  mediastinal]
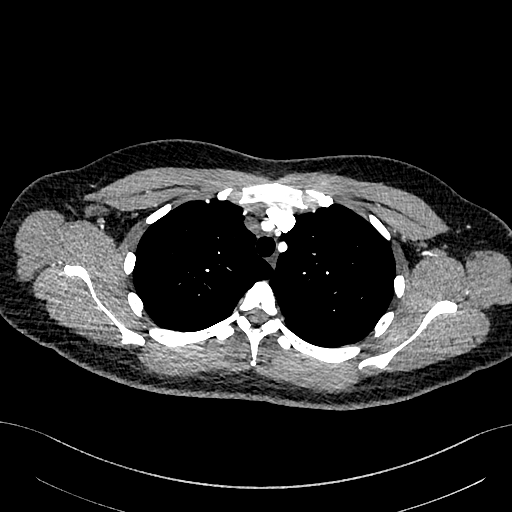
[im 334/372  lung]
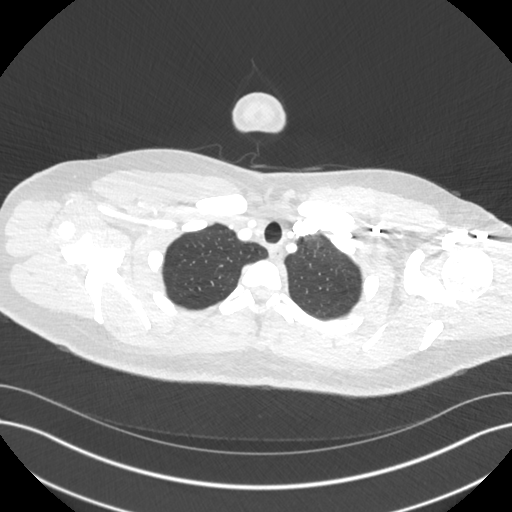

[10 of 36 positions shown; findings below may reference images not displayed]

FINDINGS: Cardiovascular: Pulmonary arteries are adequately opacified. There
is no evidence of pulmonary embolism. Central pulmonary arteries are
normal in caliber. The heart size is normal. No pericardial fluid
identified. The thoracic aorta is normal in caliber.

Mediastinum/Nodes: No enlarged mediastinal, hilar, or axillary lymph
nodes. Thyroid gland, trachea, and esophagus demonstrate no
significant findings.

Lungs/Pleura: There is no evidence of pulmonary edema,
consolidation, pneumothorax, nodule or pleural fluid.

Upper Abdomen: No acute abnormality.

Musculoskeletal: No chest wall abnormality. No acute or significant
osseous findings.

Review of the MIP images confirms the above findings.
IMPRESSION: Normal CTA of the chest. No evidence of pulmonary embolism or other
acute findings.

## 2023-04-26 IMAGING — US US EXTREM LOW VENOUS
1 series · 13 of 24 positions shown · non-contrast
Comparison: None.

CLINICAL DATA: 40-year-old female with a history of left leg pain



[Series 1: us extrem low venous · 0.07mm/px · 13 of 60 slices shown]
[im 1/60]
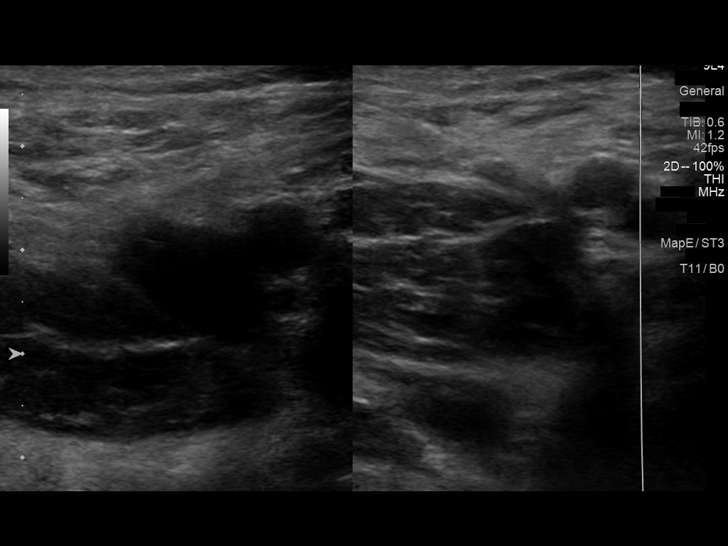
[im 6/60]
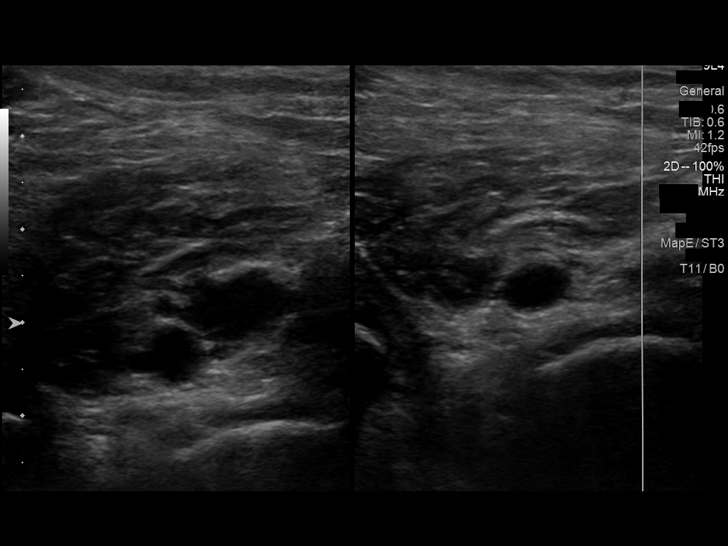
[im 11/60]
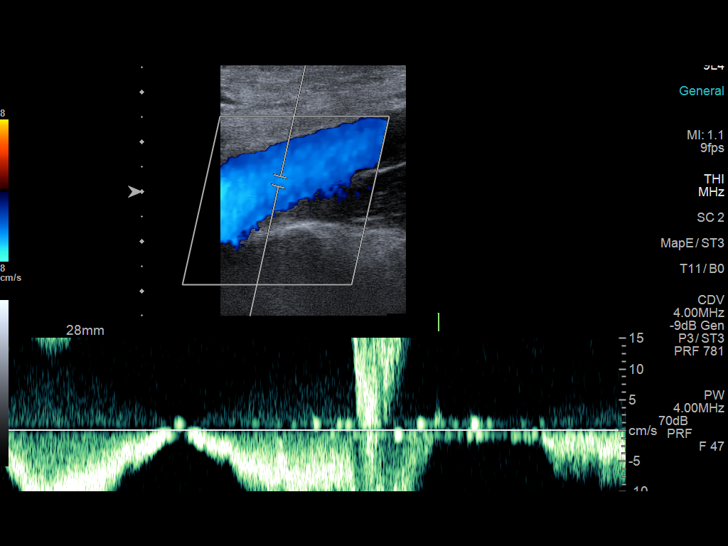
[im 16/60]
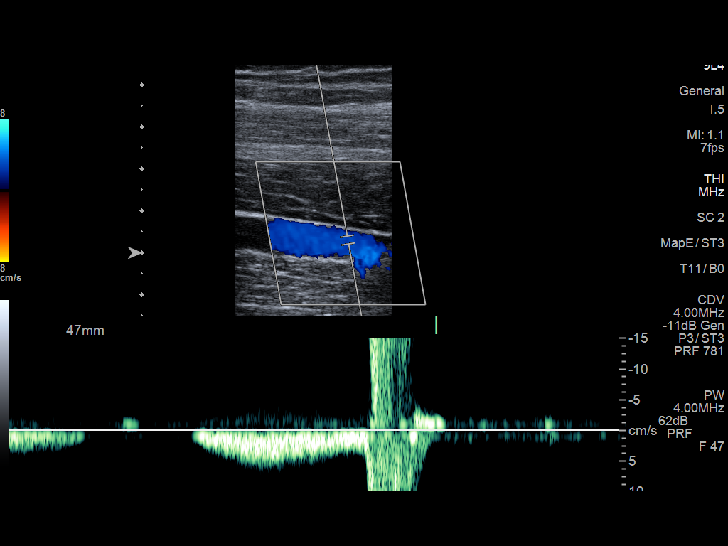
[im 21/60]
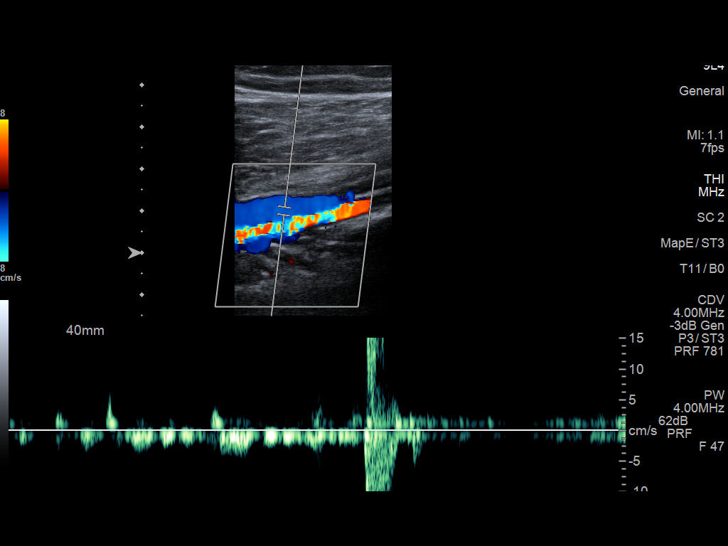
[im 26/60]
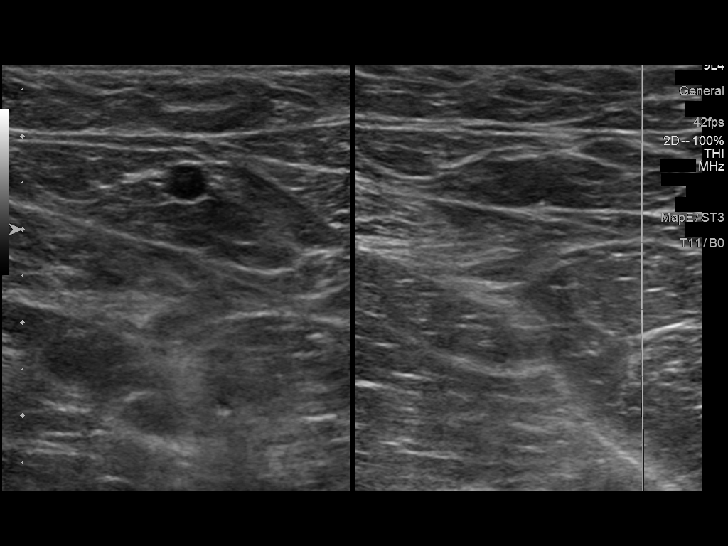
[im 31/60]
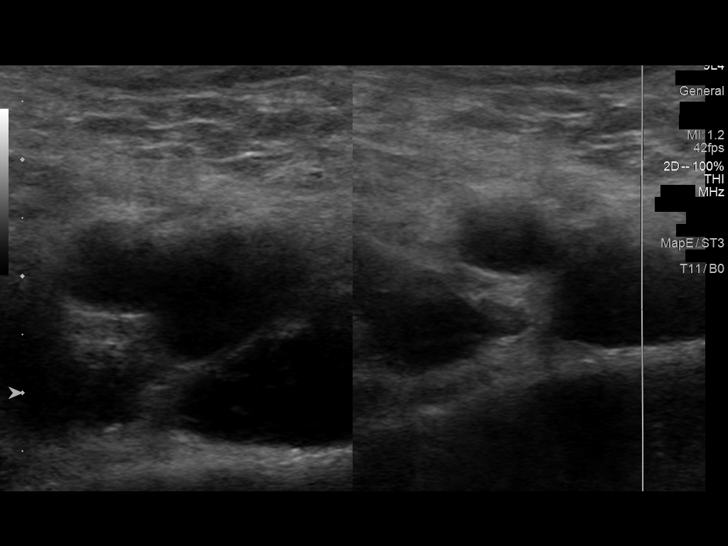
[im 34/60]
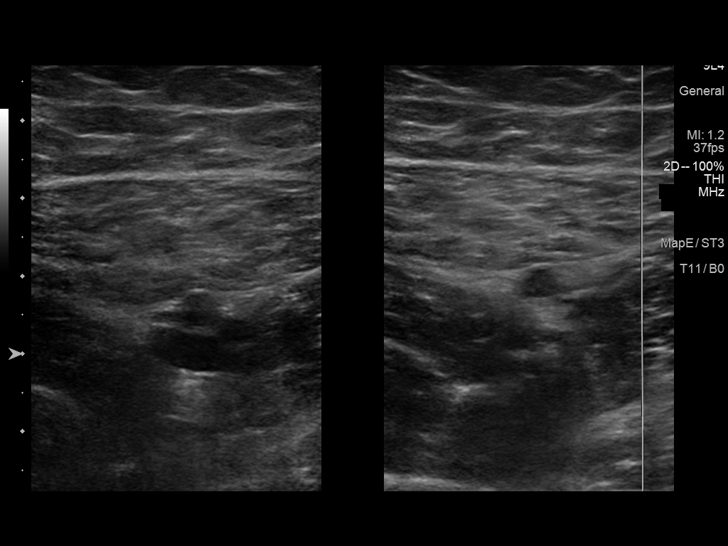
[im 39/60]
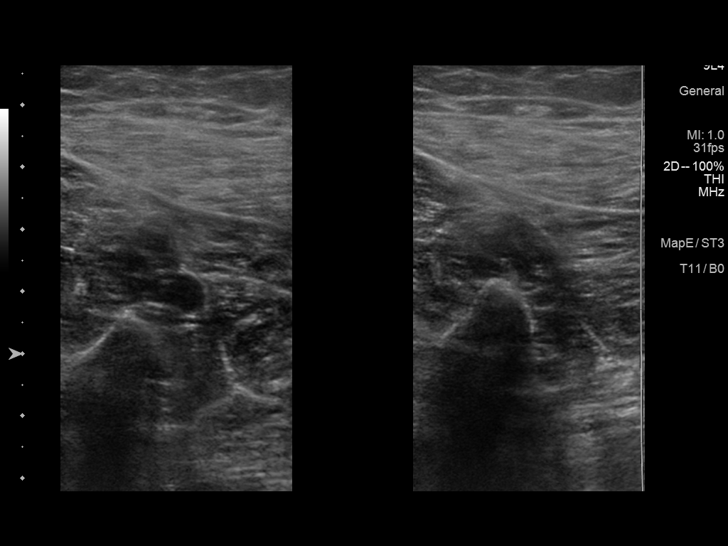
[im 44/60]
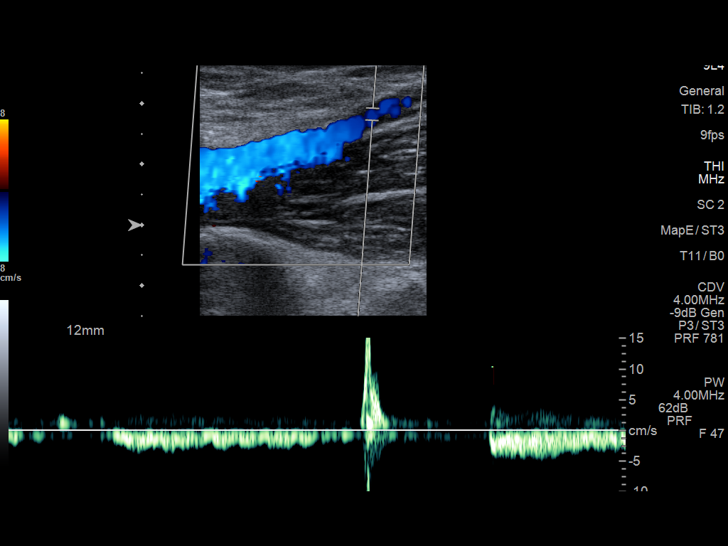
[im 49/60]
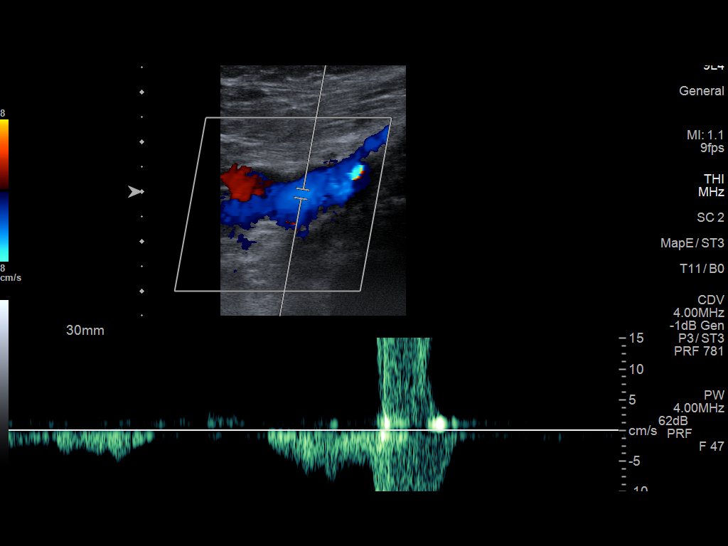
[im 54/60]
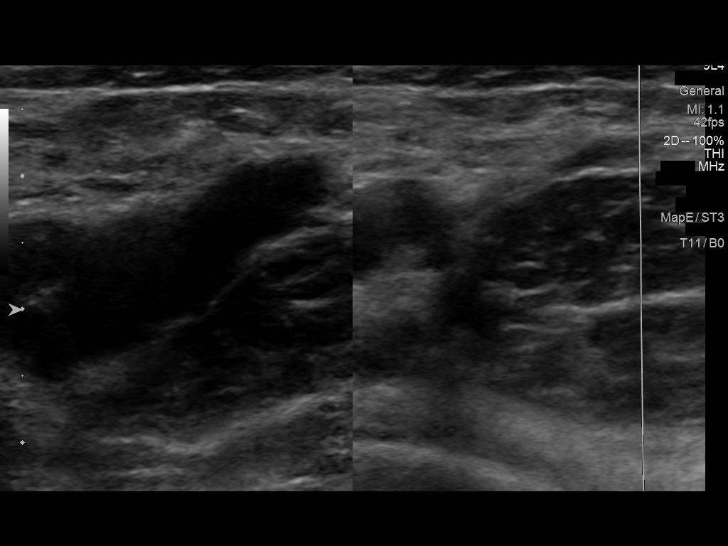
[im 60/60]
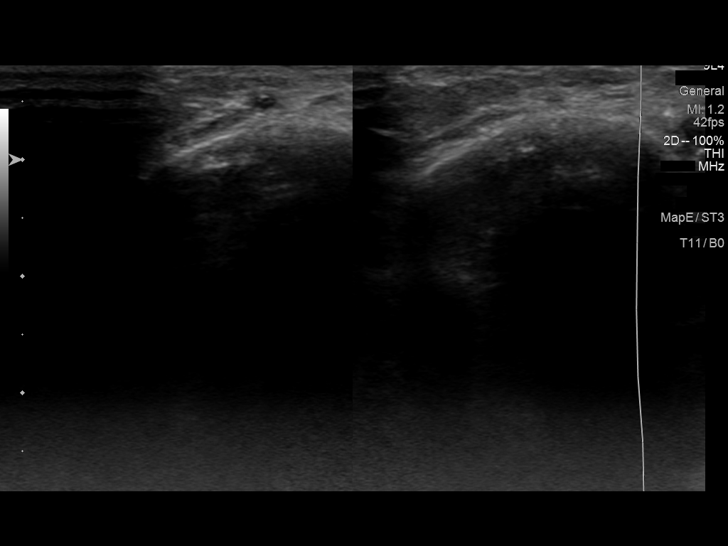

[13 of 24 positions shown; findings below may reference images not displayed]

FINDINGS: RIGHT LOWER EXTREMITY

Common Femoral Vein: No evidence of thrombus. Normal
compressibility, respiratory phasicity and response to augmentation.

Saphenofemoral Junction: No evidence of thrombus. Normal
compressibility and flow on color Doppler imaging.

Profunda Femoral Vein: No evidence of thrombus. Normal
compressibility and flow on color Doppler imaging.

Femoral Vein: No evidence of thrombus. Normal compressibility,
respiratory phasicity and response to augmentation.

Popliteal Vein: No evidence of thrombus. Normal compressibility,
respiratory phasicity and response to augmentation.

Calf Veins: No evidence of thrombus. Normal compressibility and flow
on color Doppler imaging.

Superficial Great Saphenous Vein: No evidence of thrombus. Normal
compressibility and flow on color Doppler imaging.

Other Findings:  None.

LEFT LOWER EXTREMITY

Common Femoral Vein: No evidence of thrombus. Normal
compressibility, respiratory phasicity and response to augmentation.

Saphenofemoral Junction: No evidence of thrombus. Normal
compressibility and flow on color Doppler imaging.

Profunda Femoral Vein: No evidence of thrombus. Normal
compressibility and flow on color Doppler imaging.

Femoral Vein: No evidence of thrombus. Normal compressibility,
respiratory phasicity and response to augmentation.

Popliteal Vein: No evidence of thrombus. Normal compressibility,
respiratory phasicity and response to augmentation.

Calf Veins: No evidence of thrombus. Normal compressibility and flow
on color Doppler imaging.

Superficial Great Saphenous Vein: No evidence of thrombus. Normal
compressibility and flow on color Doppler imaging.

Other Findings:  None.
IMPRESSION: Sonographic survey of the bilateral lower extremities negative for
DVT

## 2023-07-01 ENCOUNTER — Other Ambulatory Visit (HOSPITAL_COMMUNITY)
Admission: RE | Admit: 2023-07-01 | Discharge: 2023-07-01 | Disposition: A | Discharge status: Home / Self Care | Payer: Managed Care, Other (non HMO) | Source: Ambulatory Visit | Attending: Obstetrics and Gynecology | Admitting: Obstetrics and Gynecology

## 2023-07-01 ENCOUNTER — Other Ambulatory Visit: Payer: Self-pay | Admitting: Obstetrics and Gynecology

## 2023-07-01 DIAGNOSIS — Z01419 Encounter for gynecological examination (general) (routine) without abnormal findings: Secondary | ICD-10-CM | POA: Diagnosis present

## 2023-07-03 LAB — CYTOLOGY - PAP
Comment: NEGATIVE
Diagnosis: NEGATIVE
High risk HPV: NEGATIVE

## 2024-01-02 ENCOUNTER — Ambulatory Visit: Payer: Self-pay | Admitting: Cardiology

## 2024-04-13 ENCOUNTER — Ambulatory Visit: Attending: Cardiology | Admitting: Cardiology

## 2024-04-13 ENCOUNTER — Encounter: Payer: Self-pay | Admitting: Cardiology

## 2024-04-13 VITALS — BP 134/98 | HR 94 | Resp 16 | Ht 65.0 in | Wt 197.8 lb

## 2024-04-13 DIAGNOSIS — R002 Palpitations: Secondary | ICD-10-CM | POA: Diagnosis not present

## 2024-04-13 DIAGNOSIS — R9431 Abnormal electrocardiogram [ECG] [EKG]: Secondary | ICD-10-CM | POA: Diagnosis not present

## 2024-04-13 DIAGNOSIS — I471 Supraventricular tachycardia, unspecified: Secondary | ICD-10-CM | POA: Diagnosis not present

## 2024-04-13 MED ORDER — DILTIAZEM HCL 60 MG PO TABS
60.0000 mg | ORAL_TABLET | Freq: Four times a day (QID) | ORAL | 11 refills | Status: AC | PRN
Start: 2024-04-13 — End: ?

## 2024-04-13 NOTE — Progress Notes (Signed)
 Cardiology Office Note:  .   Date:  04/13/2024  ID:  Amber Davenport, DOB May 13, 1981, MRN [id number] PCP:  Elliot Charm, MD  Former Cardiology Providers: Jerel Balding, MD  [facility] Providers Cardiologist:  Madonna Large, DO , [facility] (established care 03/20/2021) Electrophysiologist:  None  Click to update primary MD,subspecialty MD or APP then REFRESH:1}    Chief Complaint  Patient presents with   Palpitations   Follow-up    History of Present Illness: .   Amber Davenport is a 43 y.o. African-American female whose past medical history and cardiovascular risk factors includes: hypertension, supraventricular tachycardia, history of COVID-19 infection (May 2022), gestational diabetes, postpartum depression, anxiety, panic attacks.  Patient was referred to the practice for palpitations.  In the past she had episodes of PSVT responsive to adenosine  and Valsalva maneuver.  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  Her second episode was during her first pregnancy and since then has been on diltiazem  on as needed basis.  She had another episode during her third pregnancy which did not respond to diltiazem  a Valsalva maneuver.  In April 2024 she went to the ED and was administered adenosine  x 1 which converted her to sinus rhythm.  Thereafter she had an unremarkable pregnancy and gave birth to a baby girl.  Patient was last seen in the office in June 2024.  Palpitations have improved significantly since the last visit. They occur primarily when she is upset, panicking, or during her menstrual cycle. No recent episodes have required medication.  No chest pain or shortness of breath. She exercises daily, performing 30 minutes of cardio in the morning and walking 2.5 miles on weekdays and 4 miles on weekends without issues. Her heart rate during exercise can reach up to 150 bpm and comes down nicely in recovery phase of her exercise routine.   Review of Systems: .   Review of Systems   Cardiovascular:  Negative for chest pain, claudication, irregular heartbeat, leg swelling, near-syncope, orthopnea, palpitations, paroxysmal nocturnal dyspnea and syncope.  Respiratory:  Negative for shortness of breath.   Hematologic/Lymphatic: Negative for bleeding problem.    Studies Reviewed:   EKG: EKG Interpretation Date/Time:  Tuesday April 13 2024 09:01:19 EDT Ventricular Rate:  86 PR Interval:  128 QRS Duration:  76 QT Interval:  380 QTC Calculation: 454 R Axis:   73  Text Interpretation: Normal sinus rhythm ST & T wave abnormality, consider inferolateral ischemia When compared with ECG of 04-Nov-2022 19:36, Since last tracing rate slower ST & T wave abnormality now are more pronounced at slower heart rate. Confirmed by Large Madonna [phone]) on 04/13/2024 9:12:30 AM  Echocardiogram: 03/28/2021:  Left ventricle cavity is normal in size and wall thickness. Normal global wall motion. Normal LV systolic function with EF 70%. Normal diastolic filling pattern.  No significant valvular abnormality.  Normal right atrial pressure.   Stress Testing: NA  RADIOLOGY: CT PE protocol.  02/28/2021: Normal CTA of the chest.  No evidence of pulmonary embolism or other acute findings.    Risk Assessment/Calculations:   NA   Labs:       Latest Ref Rng & Units 11/21/2022    5:16 AM 11/19/2022    2:54 PM 11/04/2022    7:51 PM  CBC  WBC 4.0 - 10.5 K/uL 13.3  10.5  9.8   Hemoglobin 12.0 - 15.0 g/dL 9.7  88.9  9.7   Hematocrit 36.0 - 46.0 % 29.2  34.0  30.2   Platelets 150 - 400  K/uL 164  174  146        Latest Ref Rng & Units 11/19/2022    2:54 PM 11/04/2022    7:51 PM 09/11/2014    5:25 PM  BMP  Glucose 70 - 99 mg/dL 89  98  82   BUN 6 - 20 mg/dL 5  6  6    Creatinine 0.44 - 1.00 mg/dL 9.37  9.47  9.45   Sodium 135 - 145 mmol/L 134  137  137   Potassium 3.5 - 5.1 mmol/L 4.2  3.6  3.8   Chloride 98 - 111 mmol/L 101  108  110   CO2 22 - 32 mmol/L 23  22  23    Calcium  8.9 -  10.3 mg/dL 9.4  8.1  9.2       Latest Ref Rng & Units 11/19/2022    2:54 PM 11/04/2022    7:51 PM 09/11/2014    5:25 PM  CMP  Glucose 70 - 99 mg/dL 89  98  82   BUN 6 - 20 mg/dL 5  6  6    Creatinine 0.44 - 1.00 mg/dL 9.37  9.47  9.45   Sodium 135 - 145 mmol/L 134  137  137   Potassium 3.5 - 5.1 mmol/L 4.2  3.6  3.8   Chloride 98 - 111 mmol/L 101  108  110   CO2 22 - 32 mmol/L 23  22  23    Calcium  8.9 - 10.3 mg/dL 9.4  8.1  9.2   Total Protein 6.5 - 8.1 g/dL 6.8   5.8   Total Bilirubin 0.3 - 1.2 mg/dL 0.4   0.4   Alkaline Phos 38 - 126 U/L 135   84   AST 15 - 41 U/L 30   37   ALT 0 - 44 U/L 23   30     No results found for: CHOL, HDL, LDLCALC, LDLDIRECT, TRIG, CHOLHDL No results for input(s): LIPOA in the last 8760 hours. No components found for: NTPROBNP No results for input(s): PROBNP in the last 8760 hours. No results for input(s): TSH in the last 8760 hours.  Physical Exam:    Today's Vitals   04/13/24 0858  BP: (!) 134/98  Pulse: 94  Resp: 16  SpO2: 98%  Weight: 197 lb 12.8 oz (89.7 kg)  Height: 5' 5 (1.651 m)   Body mass index is 32.92 kg/m. Wt Readings from Last 3 Encounters:  04/13/24 197 lb 12.8 oz (89.7 kg)  01/02/23 213 lb (96.6 kg)  11/19/22 229 lb 11.2 oz (104.2 kg)    Physical Exam  Constitutional: No distress.  Age appropriate, hemodynamically stable.   Neck: No JVD present.  Cardiovascular: Normal rate, regular rhythm, S1 normal, S2 normal, intact distal pulses and normal pulses. Exam reveals no gallop, no S3 and no S4.  No murmur heard. Pulmonary/Chest: Effort normal and breath sounds normal. No stridor. She has no wheezes. She has no rales.  Abdominal: Soft. Bowel sounds are normal. She exhibits no distension. There is no abdominal tenderness.  Musculoskeletal:        General: No edema.     Cervical back: Neck supple.  Neurological: She is alert and oriented to person, place, and time. She has intact cranial nerves (2-12).   Skin: Skin is warm and moist.   Impression & Recommendation(s):  Impression:   ICD-10-CM   1. Palpitations  R00.2 EKG 12-Lead    2. PSVT (paroxysmal supraventricular tachycardia) (HCC)  I47.10 diltiazem  (  CARDIZEM ) 60 MG tablet    3. Nonspecific abnormal electrocardiogram (ECG) (EKG)  R94.31 ECHOCARDIOGRAM COMPLETE       Recommendation(s):  Palpitations PSVT (paroxysmal supraventricular tachycardia) (HCC) No reoccurrence of palpitations since last office visit. Has diltiazem  to use on as needed basis but has not required it since last visit.  Still requesting a refill just in case. Since she is asymptomatic we will hold off on referring her to EP for additional evaluation at this time.  Patient agreeable with the plan of care  Nonspecific abnormal electrocardiogram (ECG) (EKG) Patient has had ST-T changes at baseline on multiple ECGs but at that time her heart rate was tachycardic.  However with improved ventricular rate patient continues to have ST-T changes in the inferolateral leads and cannot entirely rule out ischemia. Clinically she is asymptomatic. No change in functional capacity. Reemphasized importance of improving her modifiable cardiovascular risk factors. I reviewed the EKGs with her for further clarity. To further risk stratify her we will obtain an echocardiogram to make sure her LVEF is preserved and no regional wall motion normalities to suggest CAD.  In addition, if she has exertional chest pain, decreased endurance, patient is asked to call the office for sooner appointment or go to the closest ER via EMS based on acuity according to her judgment.  Patient agreeable with the plan of care.  Orders Placed:  Orders Placed This Encounter  Procedures   EKG 12-Lead   ECHOCARDIOGRAM COMPLETE    Standing Status:   Future    Expiration Date:   04/13/2025    Where should this test be performed[facility]    Does the patient weigh less than or greater than 250  lbs?:   Patient weighs less than 250 lbs    Perflutren DEFINITY (image enhancing agent) should be administered unless hypersensitivity or allergy exist:   Administer Perflutren    Reason for exam-Echo:   Other-Full Diagnosis List    Full ICD-10/Reason for Exam:   Abnormal EKG [276271]     Final Medication List:    Meds ordered this encounter  Medications   diltiazem  (CARDIZEM ) 60 MG tablet    Sig: Take 1 tablet (60 mg total) by mouth 4 (four) times daily as needed (for palpitations).    Dispense:  60 tablet    Refill:  11    Medications Discontinued During This Encounter  Medication Reason   Prenatal Vit-Fe Fumarate-FA (PRENATAL MULTIVITAMIN) TABS tablet Patient Preference   diltiazem  (CARDIZEM ) 60 MG tablet Reorder     Current Outpatient Medications:    acetaminophen  (TYLENOL ) 325 MG tablet, Take 2 tablets (650 mg total) by mouth every 6 (six) hours as needed for mild pain or moderate pain (for pain scale < 4)., Disp: , Rfl:    Calcium  Carbonate Antacid (TUMS PO), Take 1 tablet by mouth as needed (reflux)., Disp: , Rfl:    diltiazem  (CARDIZEM ) 60 MG tablet, Take 1 tablet (60 mg total) by mouth 4 (four) times daily as needed (for palpitations)., Disp: 60 tablet, Rfl: 11  Consent:   NA  Disposition:   1 year follow-up sooner if needed  Her questions and concerns were addressed to her satisfaction. She voices understanding of the recommendations provided during this encounter.    Signed, Madonna Michele HAS, [facility]  A Division of [facility] [address] [place], [place] [address]  04/13/2024 1:01 PM

## 2024-04-13 NOTE — Patient Instructions (Signed)
 Medication Instructions:  Your physician recommends that you continue on your current medications as directed. Please refer to the Current Medication list given to you today.  *If you need a refill on your cardiac medications before your next appointment, please call your pharmacy*  Lab Work: NONE  If you have labs (blood work) drawn today and your tests are completely normal, you will receive your results only by: MyChart Message (if you have MyChart) OR A paper copy in the mail If you have any lab test that is abnormal or we need to change your treatment, we will call you to review the results.  Testing/Procedures: Your physician has requested that you have an echocardiogram. Echocardiography is a painless test that uses sound waves to create images of your heart. It provides your doctor with information about the size and shape of your heart and how well your heart's chambers and valves are working. This procedure takes approximately one hour. There are no restrictions for this procedure. Please do NOT wear cologne, perfume, aftershave, or lotions (deodorant is allowed). Please arrive 15 minutes prior to your appointment time.  Please note: We ask at that you not bring children with you during ultrasound (echo/ vascular) testing. Due to room size and safety concerns, children are not allowed in the ultrasound rooms during exams. Our front office staff cannot provide observation of children in our lobby area while testing is being conducted. An adult accompanying a patient to their appointment will only be allowed in the ultrasound room at the discretion of the ultrasound technician under special circumstances. We apologize for any inconvenience.   Follow-Up: At [facility], you and your health needs are our priority.  As part of our continuing mission to provide you with exceptional heart care, our providers are all part of one team.  This team includes your primary Cardiologist  (physician) and Advanced Practice Providers or APPs (Physician Assistants and Nurse Practitioners) who all work together to provide you with the care you need, when you need it.  Your next appointment:   1 year(s)  Provider:   Madonna Large, DO

## 2024-05-17 ENCOUNTER — Ambulatory Visit (HOSPITAL_COMMUNITY)
Admission: RE | Admit: 2024-05-17 | Discharge: 2024-05-17 | Disposition: A | Discharge status: Home / Self Care | Source: Ambulatory Visit | Attending: Cardiology | Admitting: Cardiology

## 2024-05-17 DIAGNOSIS — R9431 Abnormal electrocardiogram [ECG] [EKG]: Secondary | ICD-10-CM

## 2024-05-17 DIAGNOSIS — I358 Other nonrheumatic aortic valve disorders: Secondary | ICD-10-CM

## 2024-05-17 LAB — ECHOCARDIOGRAM COMPLETE
Area-P 1/2: 4.4 cm2
S' Lateral: 2.7 cm

## 2024-05-22 ENCOUNTER — Ambulatory Visit: Payer: Self-pay | Admitting: Cardiology
